# Patient Record
Sex: Female | Born: 1970 | Race: White | Hispanic: No | Marital: Single | Smoking: Current every day smoker
Health system: Southern US, Community
[De-identification: ages and names within clinical notes are randomized; demographics above are authoritative.]

## PROBLEM LIST (undated history)

## (undated) DIAGNOSIS — E785 Hyperlipidemia, unspecified: Secondary | ICD-10-CM

## (undated) DIAGNOSIS — Z8619 Personal history of other infectious and parasitic diseases: Secondary | ICD-10-CM

## (undated) DIAGNOSIS — F329 Major depressive disorder, single episode, unspecified: Secondary | ICD-10-CM

## (undated) DIAGNOSIS — F419 Anxiety disorder, unspecified: Secondary | ICD-10-CM

## (undated) DIAGNOSIS — E119 Type 2 diabetes mellitus without complications: Secondary | ICD-10-CM

## (undated) DIAGNOSIS — J449 Chronic obstructive pulmonary disease, unspecified: Secondary | ICD-10-CM

## (undated) DIAGNOSIS — R0602 Shortness of breath: Secondary | ICD-10-CM

## (undated) DIAGNOSIS — K760 Fatty (change of) liver, not elsewhere classified: Secondary | ICD-10-CM

## (undated) DIAGNOSIS — M8580 Other specified disorders of bone density and structure, unspecified site: Secondary | ICD-10-CM

## (undated) DIAGNOSIS — D649 Anemia, unspecified: Secondary | ICD-10-CM

## (undated) DIAGNOSIS — J45909 Unspecified asthma, uncomplicated: Secondary | ICD-10-CM

## (undated) DIAGNOSIS — F32A Depression, unspecified: Secondary | ICD-10-CM

## (undated) DIAGNOSIS — Z86718 Personal history of other venous thrombosis and embolism: Secondary | ICD-10-CM

## (undated) DIAGNOSIS — K219 Gastro-esophageal reflux disease without esophagitis: Secondary | ICD-10-CM

## (undated) HISTORY — DX: Fatty (change of) liver, not elsewhere classified: K76.0

## (undated) HISTORY — PX: OTHER SURGICAL HISTORY: SHX169

## (undated) HISTORY — DX: Personal history of other infectious and parasitic diseases: Z86.19

## (undated) HISTORY — PX: TONSILLECTOMY: SUR1361

## (undated) HISTORY — PX: CHOLECYSTECTOMY: SHX55

## (undated) HISTORY — DX: Other specified disorders of bone density and structure, unspecified site: M85.80

## (undated) HISTORY — PX: TUBAL LIGATION: SHX77

## (undated) HISTORY — DX: Gastro-esophageal reflux disease without esophagitis: K21.9

## (undated) HISTORY — DX: Personal history of other venous thrombosis and embolism: Z86.718

## (undated) HISTORY — DX: Hyperlipidemia, unspecified: E78.5

---

## 2000-11-18 ENCOUNTER — Other Ambulatory Visit: Admission: RE | Admit: 2000-11-18 | Discharge: 2000-11-18 | Payer: Self-pay | Admitting: Internal Medicine

## 2001-04-10 ENCOUNTER — Emergency Department (HOSPITAL_COMMUNITY): Admission: EM | Admit: 2001-04-10 | Discharge: 2001-04-10 | Payer: Self-pay | Admitting: Emergency Medicine

## 2001-04-12 ENCOUNTER — Encounter: Payer: Self-pay | Admitting: Emergency Medicine

## 2001-04-12 ENCOUNTER — Ambulatory Visit (HOSPITAL_COMMUNITY): Admission: RE | Admit: 2001-04-12 | Discharge: 2001-04-12 | Payer: Self-pay | Admitting: Emergency Medicine

## 2001-05-13 ENCOUNTER — Observation Stay (HOSPITAL_COMMUNITY): Admission: RE | Admit: 2001-05-13 | Discharge: 2001-05-14 | Payer: Self-pay | Admitting: General Surgery

## 2001-08-27 ENCOUNTER — Encounter: Payer: Self-pay | Admitting: *Deleted

## 2001-08-27 ENCOUNTER — Emergency Department (HOSPITAL_COMMUNITY): Admission: EM | Admit: 2001-08-27 | Discharge: 2001-08-27 | Payer: Self-pay | Admitting: *Deleted

## 2001-09-02 ENCOUNTER — Encounter: Payer: Self-pay | Admitting: Orthopaedic Surgery

## 2001-09-02 ENCOUNTER — Ambulatory Visit (HOSPITAL_COMMUNITY): Admission: RE | Admit: 2001-09-02 | Discharge: 2001-09-02 | Payer: Self-pay | Admitting: Orthopaedic Surgery

## 2001-11-11 ENCOUNTER — Encounter: Payer: Self-pay | Admitting: *Deleted

## 2001-11-11 ENCOUNTER — Ambulatory Visit (HOSPITAL_COMMUNITY): Admission: RE | Admit: 2001-11-11 | Discharge: 2001-11-11 | Payer: Self-pay | Admitting: *Deleted

## 2002-03-31 ENCOUNTER — Encounter: Payer: Self-pay | Admitting: *Deleted

## 2002-03-31 ENCOUNTER — Emergency Department (HOSPITAL_COMMUNITY): Admission: EM | Admit: 2002-03-31 | Discharge: 2002-03-31 | Payer: Self-pay | Admitting: *Deleted

## 2003-01-21 ENCOUNTER — Emergency Department (HOSPITAL_COMMUNITY): Admission: EM | Admit: 2003-01-21 | Discharge: 2003-01-21 | Payer: Self-pay | Admitting: Emergency Medicine

## 2003-03-15 ENCOUNTER — Other Ambulatory Visit: Admission: RE | Admit: 2003-03-15 | Discharge: 2003-03-15 | Payer: Self-pay | Admitting: Obstetrics and Gynecology

## 2003-04-16 ENCOUNTER — Emergency Department (HOSPITAL_COMMUNITY): Admission: EM | Admit: 2003-04-16 | Discharge: 2003-04-16 | Payer: Self-pay | Admitting: Emergency Medicine

## 2003-10-29 ENCOUNTER — Inpatient Hospital Stay (HOSPITAL_COMMUNITY): Admission: RE | Admit: 2003-10-29 | Discharge: 2003-10-31 | Payer: Self-pay | Admitting: Obstetrics and Gynecology

## 2004-11-18 ENCOUNTER — Ambulatory Visit: Payer: Self-pay | Admitting: Internal Medicine

## 2004-12-09 ENCOUNTER — Ambulatory Visit (HOSPITAL_COMMUNITY): Admission: RE | Admit: 2004-12-09 | Discharge: 2004-12-09 | Payer: Self-pay | Admitting: Internal Medicine

## 2004-12-09 ENCOUNTER — Ambulatory Visit: Payer: Self-pay | Admitting: Internal Medicine

## 2005-02-05 ENCOUNTER — Ambulatory Visit: Payer: Self-pay | Admitting: Internal Medicine

## 2005-06-04 ENCOUNTER — Ambulatory Visit (HOSPITAL_COMMUNITY): Admission: RE | Admit: 2005-06-04 | Discharge: 2005-06-04 | Payer: Self-pay | Admitting: Family Medicine

## 2005-11-02 ENCOUNTER — Ambulatory Visit (HOSPITAL_COMMUNITY): Admission: RE | Admit: 2005-11-02 | Discharge: 2005-11-02 | Payer: Self-pay | Admitting: Obstetrics and Gynecology

## 2005-12-14 ENCOUNTER — Observation Stay (HOSPITAL_COMMUNITY): Admission: AD | Admit: 2005-12-14 | Discharge: 2005-12-15 | Payer: Self-pay | Admitting: Obstetrics and Gynecology

## 2006-01-22 ENCOUNTER — Ambulatory Visit (HOSPITAL_COMMUNITY): Admission: AD | Admit: 2006-01-22 | Discharge: 2006-01-22 | Payer: Self-pay | Admitting: Obstetrics and Gynecology

## 2006-01-26 ENCOUNTER — Ambulatory Visit (HOSPITAL_COMMUNITY): Admission: AD | Admit: 2006-01-26 | Discharge: 2006-01-26 | Payer: Self-pay | Admitting: Obstetrics and Gynecology

## 2006-01-29 ENCOUNTER — Ambulatory Visit (HOSPITAL_COMMUNITY): Admission: AD | Admit: 2006-01-29 | Discharge: 2006-01-29 | Payer: Self-pay | Admitting: Obstetrics and Gynecology

## 2006-02-01 ENCOUNTER — Inpatient Hospital Stay (HOSPITAL_COMMUNITY): Admission: RE | Admit: 2006-02-01 | Discharge: 2006-02-03 | Payer: Self-pay | Admitting: Obstetrics and Gynecology

## 2006-02-01 ENCOUNTER — Encounter (INDEPENDENT_AMBULATORY_CARE_PROVIDER_SITE_OTHER): Payer: Self-pay | Admitting: *Deleted

## 2006-07-27 ENCOUNTER — Ambulatory Visit (HOSPITAL_COMMUNITY): Admission: RE | Admit: 2006-07-27 | Discharge: 2006-07-27 | Payer: Self-pay | Admitting: Family Medicine

## 2007-01-12 ENCOUNTER — Ambulatory Visit: Payer: Self-pay | Admitting: Orthopedic Surgery

## 2007-05-03 ENCOUNTER — Ambulatory Visit: Payer: Self-pay | Admitting: Internal Medicine

## 2007-05-05 ENCOUNTER — Ambulatory Visit (HOSPITAL_COMMUNITY): Admission: RE | Admit: 2007-05-05 | Discharge: 2007-05-05 | Payer: Self-pay | Admitting: Internal Medicine

## 2007-09-11 ENCOUNTER — Emergency Department (HOSPITAL_COMMUNITY): Admission: EM | Admit: 2007-09-11 | Discharge: 2007-09-11 | Payer: Self-pay | Admitting: Emergency Medicine

## 2007-10-25 ENCOUNTER — Ambulatory Visit: Payer: Self-pay | Admitting: Internal Medicine

## 2007-10-26 ENCOUNTER — Ambulatory Visit (HOSPITAL_COMMUNITY): Admission: RE | Admit: 2007-10-26 | Discharge: 2007-10-26 | Payer: Self-pay | Admitting: Internal Medicine

## 2009-10-03 ENCOUNTER — Other Ambulatory Visit: Admission: RE | Admit: 2009-10-03 | Discharge: 2009-10-03 | Payer: Self-pay | Admitting: Obstetrics & Gynecology

## 2010-05-13 ENCOUNTER — Ambulatory Visit: Payer: Self-pay | Admitting: Cardiology

## 2010-06-24 ENCOUNTER — Encounter: Admission: RE | Admit: 2010-06-24 | Discharge: 2010-06-24 | Payer: Self-pay | Admitting: Gastroenterology

## 2010-07-01 ENCOUNTER — Encounter: Admission: RE | Admit: 2010-07-01 | Discharge: 2010-07-01 | Payer: Self-pay | Admitting: Gastroenterology

## 2010-08-30 ENCOUNTER — Encounter: Payer: Self-pay | Admitting: Gastroenterology

## 2010-12-23 NOTE — Assessment & Plan Note (Signed)
NAME:  [name] [name], [name] [name]                 CHART#:  [id number]   DATE:                                   DOB:  1971-07-10   REASON FOR CONSULTATION:  Elevated liver enzymes.   HISTORY OF PRESENT ILLNESS:  The patient is a very pleasant 40 year old  Caucasian female sent over at the courtesy of Dr. Earley Favor for  further evaluation of elevated liver enzymes.  According to the  accompanying data, the patient was noted to have an elevated alkaline  phosphatase, AST and ALT back on 04/22/2007.  Specifically, she had an  alkaline phosphatase of 121 which is actually down from 149 in October  of 2006.  AST and ALT were elevated at 176 and 60 respectively as  compared to 58 and 188 respectively back in the fall of 2006.   Serological workup followed including a negative HIV screen, hepatitis  B, surface antigen and hepatitis C antibody came back negative.  The  patient denies ever being told she had hepatitis.  She denies ever  having yellow jaundice.  She tells me she has donated blood at the [facility] as recently as 5 years ago and never received a rejection letter.   I do have an old set of labs from [facility] from April of  2006 which revealed an ALT of 15, AST of 14, total bilirubin 0.5,  alkaline phosphatase of 70.   There is no family history of liver disease.  There is no history of  herbal/vitamin/over-the-counter nutritional supplements.   She does have a history of tattoos.  She acquired one earlier this year  and another some 5 years ago.  No history of sexual promiscuity or  street drugs/parenteral drug use.  She has never received a blood  transfusion and she does not consume alcohol.   It is notable this lady has gained a good 34 pounds in the past 2 years  according to our records.   PAST MEDICAL HISTORY:  Longstanding gastroesophageal reflux disease,  symptoms controlled with omeprazole 20 mg orally daily.  Dr. Dionicia Abler saw  this nice lady back in 2006 when  she was evaluated for reflux and  atypical chest pains along with chronic constipation and intermittent  episodes of hematochezia.  She underwent EGD and colonoscopy on  12/09/2004 which revealed a small hiatal hernia, two small polyps in the  colon which were biopsied/removed.  She also had some internal  hemorrhoids thought to be the cause of bleeding.  The polyps were benign  histologically.  She has a history of anxiety, neurosis, depression.   PAST SURGICAL HISTORY:  Tubal ligation, endoscopic evaluation outlined  above, cholecystectomy, cholelithiasis years ago, tonsillectomy.   CURRENT MEDICATIONS:  1. Motrin p.r.n.  2. Midol p.r.n.  3. Tylenol Sinus p.r.n.  4. Omeprazole 20 mg daily.  5. Diazepam 10 mg b.i.d.  6. Paroxetine HCL 20 mg daily.   ALLERGIES:  No known drug allergies.   FAMILY HISTORY:  Father died with metastatic cancer to the lymph nodes,  liver, lung, primary unknown to me.  No history of chronic GI or liver  illness otherwise.   SOCIAL HISTORY:  Patient is single and has two twins that are 40 months  old.  She was working in a [employer]  but quit to take care of  her kids.  She smokes two packs of cigarettes per day.  No alcohol.  No  illicit drugs.   REVIEW OF SYSTEMS:  No chest pain.  No dyspnea on exertion.  She does  not have any early __________  nausea or vomiting, and no odynophagia  and dysphagia.  She does have vague upper abdominal pain from time to  time, and has alternating constipation and diarrhea over the years  chronically, more nonbloody diarrhea recently.   PHYSICAL EXAMINATION:  GENERAL:  Reveals a pleasant 40 year old lady  resting comfortably.  VITAL SIGNS:  Weight:  160, was 126 on 11/18/2004 by our records.  Temperature:  98.1.  Blood pressure:  100/80.  Pulse:  80.  Height:  5'1.  SKIN:  Warm and dry.  She has tattoos but no jaundice __________  chronic liver disease.  HEENT:  No scleral icterus.  Conjunctivae are pink.   Oral cavity, no  lesions.  CHEST:  Lungs are clear to auscultation.  CARDIAC:  Regular rate and rhythm without breast exam.  ABDOMEN:  Nondistended.  Positive bowel sounds, soft and nontender  without appreciable mass or hepatosplenomegaly.  EXTREMITIES:  No edema.   IMPRESSION:  The patient is a very pleasant 40 year old lady with a  notable elevation in her aminotransferase and alkaline phosphatase going  back now two years.  It is good to see she had a negative screen for  hepatitis B, C and HIV.  She has gained a significant amount of weight  over the past two years and I suspect we may well be dealing with  nonalcoholic fatty liver disease, specifically steatohepatitis.  She  does need further evaluation of other potential causes of mixed  hepatocellular and cholestatic enzyme picture.  We will need to screen  her for autoimmune hepatitis as well as primary biliary cirrhosis and  iron overload.  I doubt she has a retained common duct stone.   RECOMMENDATIONS:  We will obtain a right upper quadrant/hepatic  ultrasound, repeat hepatic profile, obtain an INR today.  We will go  ahead and get parent studies on this lady and obtain an AMA, ANA, and  antismooth muscle antibody today.   I spent some time talking to the patient about obesity and the  relationship between steatohepatitis.  I have encouraged weight loss and  strive to obtain a more ideal body weight with aerobic exercise at least  30 minutes three times a week.  I will make further recommendations in  the very near future once we have the above mentioned studies back for  review.   I would like to thank Dr. Earley Favor for allowing me to see this  nice lady today.       Jonathon Bellows, M.D.  Electronically Signed     RMR/MEDQ  D:  05/03/2007  T:  05/03/2007  Job:  [id number]   cc:   Jeoffrey Massed, MD

## 2010-12-23 NOTE — Assessment & Plan Note (Signed)
NAME:  [name] [name], [name] [name]                 CHART#:  [id number]   DATE:  10/25/2007                       DOB:  03-23-1971   PRIMARY CARE PHYSICIAN:  Vivia Ewing, M.D.   CHIEF COMPLAINT:  Chronic diarrhea.   HISTORY OF PRESENT ILLNESS:  [name] [name] is a 40 year old Caucasian female.  She has previously been evaluated by [place] for chronic constipation as well  as GERD and transaminitis back last fall. She was felt to have fatty  liver and therefore, she underwent AMA, ANA, and anti-SMA, which were  all negative. She did not return for followup until today. She was  referred because of a 1 month history of chronic diarrhea. She states  every time she eats, she feels sick. She complains of nausea. She has to  make herself eat. She has lost 22 pounds in the last 6 months. She is  having about 6 loose, watery bowel movements per day with some mucous.  Denies any blood. She notes the weight loss is entirely unintentional.  She denies any recent antibiotic use. Denies any medications. She  occasional takes some Motrin. She is on Omeprazole 20 mg daily. She was  having some breakthrough heartburn and indigestion. This was increased  to b.i.d. last week. She was given Lomotil. This did seem to help some.  She has tried Imodium over-the-counter, which helps a little too. She  denies any problems with anxiety or depression at this time. She is  under a significant amount of stress with a set of 23-year-old twins that  are autistic and a 77-year-old child at home as well. She does stay at  home with the children currently. She is having abdominal pain. It is  mostly from her mid to lower abdomen. It does radiate through to her low  back.   She has had an EGD and colonoscopy by Dr. Karilyn Cota on Dec 09, 2004, which  showed a small hiatal hernia, 2 polyps in the colon, which were biopsied  and benign and internal hemorrhoids, thought to be the cause of her  bleeding.   She had white blood cell count of 12.2, normal  hemoglobin and  hematocrit, and platelet count. She had an alkaline phosphatase of 142,  AST of 45, and otherwise, normal CMP. She had a normal TSH, normal sed  rate and amylase and lipase. A normal C-reactive protein and negative H-  Pylori.   CURRENT MEDICATIONS:  See the list from October 25, 2007.   ALLERGIES:  NO KNOWN DRUG ALLERGIES.   PHYSICAL EXAMINATION:  VITAL SIGNS:  Weight 138.5 pounds. Height 61  inches. Temperature 98.1, blood pressure 100/78, pulse 80.  GENERAL:  A well developed, well nourished, Caucasian female in no acute  distress.  HEENT:  Sclerae clear. Conjunctivae pink. Oropharynx pink and moist  without any lesions.  NECK:  Supple. She does have bilateral swelling to her neck, non-tender  without firm masses or discrete thyromegaly. May represent goiter.  CHEST/HEART:  Regular rate and rhythm. Normal S1 and S2. Without murmur,  rub, or gallop.  ABDOMEN:  Positive bowel sounds x4. No bruits auscultated. Soft,  nontender, and nondistended. Without palpable hepatosplenomegaly. No  rebound or guarding.  EXTREMITIES:  Without clubbing or edema bilaterally.   ASSESSMENT:  [name] [name] is a 40 year old female with a significant weight  loss  of 22 pounds in the last 6 months, unintentionally. She has also  had chronic diarrhea for the last month. She has chronic  gastroesophageal reflux disease and has had to increase her PPI to  b.i.d. She is having chronic diarrhea with upward of 6 loose stools per  day. She had normal TSH and there is no evidence of inflammation with  CRP, making inflammatory bowel disease less likely. It is possible that  she could have irritable bowel syndrome, however, significant weight  loss is a red flag. I would be concerned about ruling out lymphoma,  celiac disease, and less likely would be inflammatory bowel disease,  given a negative CRP. It is reassuring that she did have an essentially  normal colonoscopy back in 2006. Given her  leukocytosis, chronic  abdominal pain, chronic diarrhea, and weight loss, she is going to  undergo CT scan of the abdomen and pelvis for further evaluation.   PLAN:  1. Full set of stool studies to include ova and parasite, culture and      sensitivity, C-diff, and lactoferrin.  2. Levsin 0.125 mg a.c. and at bedtime, #60 with no refills.  3. CT of abdomen and pelvis with IV and oral contrast.       Lorenza Burton, N.P.  Electronically Signed     R. Roetta Sessions, M.D.  Electronically Signed    KJ/MEDQ  D:  10/25/2007  T:  10/25/2007  Job:  [id number]   cc:   Francoise Schaumann. Halm, DO, FAAP

## 2010-12-26 NOTE — H&P (Signed)
NAME:  [name], [name]                ACCOUNT NO.:  [id number]   MEDICAL RECORD NO.:  [id number]          PATIENT TYPE:  OIB   LOCATION:  LDR5                          FACILITY:  [facility]   PHYSICIAN:  Tilda Burrow, M.D. DATE OF BIRTH:  [birth year]   DATE OF ADMISSION:  12/14/2005  DATE OF DISCHARGE:  LH                                HISTORY & PHYSICAL   REASON FOR ADMISSION:  Pregnancy at 28 weeks and 6 days with twin gestation  with diastolic blood pressure of 100.   MEDICAL HISTORY:  Positive for reflux and anxiety.   SURGICAL HISTORY:  Positive for cesarean section x2 and gallbladder.   ALLERGY:  She is allergic to SULFA.   MEDICATIONS:  She is on Lexapro.   FAMILY HISTORY:  Benign.   PRENATAL COURSE:  Has been complicated by twin gestation, female sex.  Ultrasound today shows normal growth of both fetuses.  Blood type is A  positive.  UDS is negative.  Rubella immune.  Hepatitis B surface antigen is  negative.  HIV is nonreactive.  HSV 2 is positive.  Serology is nonreactive.  Pap is normal.  GC and Chlamydia are negative.  The 28-week hemoglobin 10.3,  28-week hematocrit 32.3.  The 1-hour glucose 152, 3 hours as follows:  71,  166, 150, and 113.   PHYSICAL EXAMINATION:  Today, ultrasound reveals 2 stable intrauterine  fetuses both female.  Normal growth.  VITAL SIGNS:  Blood pressure diastolic is 100.  She has a trace of protein  and DTRs are 3+ to 4+ with no clonus.  HEART:  Regular rate and rhythm.  LUNGS:  Clear to auscultation bilaterally.  ABDOMEN:  Gravid, soft, nontender.   PLAN:  Admit, do a preeclampsia workup, and monitor.      Zerita Boers, Lanier Clam      Tilda Burrow, M.D.  Electronically Signed    DL/MEDQ  D:  16/05/9603  T:  12/14/2005  Job:  [id number]   cc:   Tilda Burrow, M.D.  Fax: [phone]

## 2010-12-26 NOTE — Op Note (Signed)
NAME:  [name] [name], [name] [name]                ACCOUNT NO.:  [id number]   MEDICAL RECORD NO.:  [id number]          PATIENT TYPE:  AMB   LOCATION:  DAY                           FACILITY:  [facility]   PHYSICIAN:  Lionel December, M.D.    DATE OF BIRTH:  08/25/1970   DATE OF PROCEDURE:  12/09/2004  DATE OF DISCHARGE:                                 OPERATIVE REPORT   PROCEDURE:  Esophagogastroduodenoscopy followed by colonoscopy.   ENDOSCOPIST:  Lionel December, M.D.   INDICATIONS FOR PROCEDURE:  [name] [name] is a 40 year old Caucasian female who  has a four-year history of gastroesophageal reflux disease.  Her symptoms  have been poorly controlled with three PPI's.  She also complains of  constipation and has had intermittent hematochezia.  She was recently seen  in the office and noted to have heme-positive stools.  She underwent a  diagnostic evaluation.   INFORMED CONSENT:  The procedure is reviewed with the patient and an  informed consent was obtained.   PREOPERATIVE MEDICATIONS:  Cetacaine spray for pharyngeal topical  anesthesia, Demerol 30 mg IV, Versed 10 mg IV in divided dose.   FINDINGS/DESCRIPTION OF PROCEDURE:  The procedure was performed in the  endoscopy suite.  The patient's vital signs and O2 saturation were monitored  during the procedure and remained stable.   ESOPHAGOGASTRODUODENOSCOPY:  The patient was placed in the left lateral  supine position.  The Olympus videoscope was passed in the oropharynx  without difficulty in to the esophagus.  The patient was somewhat agitated,  but I was able to quickly complete the exam.   ESOPHAGUS:  The mucosa was normal.  The GE junction was unremarkable.  There  was a small sliding hiatal hernia which was apparent.  She heaved three  times.   STOMACH:  It has some bile in it, but there was no food debris.  The stomach  distended very well with insufflation.  The folds in the proximal stomach  were normal.  Examination of the mucosa, body,  antrum, pyloric channel,  angularis, fundus and cardia were normal.   DUODENUM:  The bulbar mucosa was normal.  The scope was passed to the second  part of the duodenum.  The mucosa and folds are normal.  The endoscope was  withdrawn, and the patient was prepared for procedure number two.   COLONOSCOPY:  A rectal examination was performed.  No abnormality was noted  on external or digital exam.  The Olympus videoscope was placed through the  rectum and advanced under vision into the sigmoid colon and beyond.  The  prep was satisfactory.  The scope was passed to the cecum which was  identified by the appendiceal orifice and the ileocecal valve.  Pictures  were taken for the record.  A short segment of TI was also examined and was  normal.  As the scope was withdrawn.  The colonic mucosa was carefully  examined.  There was a single polyp at the transverse colon with erosion on  it.  This was cold biopsied.  The mucosa, the rest of the colon and the  rectum were normal.  The scope was retroflexed and examined in retroflexion,  and she had hemorrhoids above the dentate line.  The endoscope was  withdrawn.   The patient tolerated the procedures well.   FINAL DIAGNOSES:  1.  Small sliding hiatal hernia, otherwise normal      esophagogastroduodenoscopy.  2.  Small polyp located and biopsied from the transverse colon.  3.  Small internal hemorrhoids, felt to be the source of her hematochezia in      the setting of constipation.   RECOMMENDATIONS:  1.  Antireflux measures reinforced.  She needs to quit cigarette smoking.  2.  _____ 40 mg p.o. q.a.m.  She will pick up samples from the office.  3.  A high-fiber diet.  4.  Citrucel one tablespoonful daily at bedtime.  5.  Lactulose two tablespoonfuls daily at bedtime.  6.  She will keep a stool diary and return for OV in eight weeks from now.  7.  I will contact the patient with the biopsy results.  8.  We plan to see her back in eight  weeks.      NR/MEDQ  D:  12/09/2004  T:  12/09/2004  Job:  [id number]   cc:   Jeoffrey Massed, MD  [address]  [place]  [place] [address]  Fax: [phone]

## 2010-12-26 NOTE — Op Note (Signed)
NAME:  [name] [name], [name] [name]                ACCOUNT NO.:  [id number]   MEDICAL RECORD NO.:  [id number]          PATIENT TYPE:  INP   LOCATION:  A413                          FACILITY:  [facility]   PHYSICIAN:  Tilda Burrow, M.D. DATE OF BIRTH:  10/01/1970   DATE OF PROCEDURE:  DATE OF DISCHARGE:                                 OPERATIVE REPORT   PREOPERATIVE DIAGNOSES:  1.  Pregnancy at 37 weeks 5 days.  2.  Twin gestation.  3.  Desire for elective sterilization.  4.  Prior cesarean section after trial of labor.   POSTOPERATIVE DIAGNOSES:  1.  Pregnancy at 37 weeks 5 days, delivered.  2.  Twin gestation.  3.  Desire for elective sterilization.  4.  Prior cesarean section after trial of labor.  5.  Footling breech presentation x2.   PROCEDURES:  1.  Repeat low-transverse cervical cesarean section.  2.  Bilateral partial salpingectomy.  3.  Wide excision of cicatrix.   SURGEON:  Tilda Burrow, M.D.   ASSISTANTAmie Critchley, CST.   ANESTHESIA:  Spinal.   COMPLICATIONS:  None.   FINDINGS:  A healthy female infant x2.  Baby A was 5 pounds 14 ounces, female,  Apgars 9 and 9.  Baby B was 6 pounds 3.6 ounces, female, Apgars 9 and 9.  EBL  500 cc's.   DETAILS OF THE PROCEDURE:  The patient was taken to the operating room and  prepped and draped after spinal anesthesia with a Foley catheter in place.  Prior Pfannenstiel incision was performed, excising approximately 2 inches,  5-6 cm of skin and underlying fatty tissue, taking out the old scar.  We  opened the fascia transversely easily entered the abdominal cavity through a  midline incision. There was extensive thin, filmy omental adhesions to the  anterior abdominal wall and bladder flap area which could be resected and  quickly.  A bladder flap was developed on the lower uterine segment.  A  transverse uterine incision was performed and extended laterally using index  finger traction and the feet of baby A was grasped and complete  Breech  extraction performed using fundal pressure as the main propulsive force.  The arms were swept in front of the body and the head delivered in a  controlled fashion.  Bulb suction was performed promptly and the cord  clamped and the infant passed to Dr. Milinda Cave in good condition.  Apgars of 9  and 9 were assigned.   Meanwhile, the feet of baby B were identified through the membranes.  The  membranes were ruptured, the feet grasped, and again a complete Breech  extraction was performed using fundal pressure as the primary propulsive  force.  Bulb suction was performed as soon as the mouth was available and  the baby was delivered easily in a controlled fashion throughout.  The cord  was clamped.  The infant was passed to Dr. Milford Cage.  Apgars of 9 and 9 were  assigned subsequently.   Cord blood samples were obtained from babies A and B and then the placenta  delivered by Crede's  massage with placenta sent for pathology.  Each cord  had been a three-vessel cord.  Special note:  Baby B had had a nuchal cord  x2.   Amniotic fluid was clear and without malodor in both bags-of-water.   The uterus was closed with a single layer of running locking 0 chromic with  good tissue approximation. The bladder flap was reapproximated using 2-0  chromic.   TUBAL LIGATION:  The tubal ligation was performed by grasping each tube in  its mid-portion, doubly ligating around the incarcerated knuckle of tube and  excising the incarcerated specimen.  This was performed bilaterally.  No  cautery was used.   The peritoneum was closed with 2-0 chromic.   Wide excision of cicatrix was then completed by identifying that there was a  significant amount of laxity in the fascial layer.  Approximately 1-inch of  fascia was removed from the midline with tapering to each side, resulting in  less redundancy of the anterior abdominal wall tissues.  This was pulled  together with continuous running 0 Vicryl.  The  external and internal  oblique's were sewn separately on each side for a distance of approximately  5 cm.   The fibrotic portion of the scar over the mons pubis was mobilized to allow  tissue edge approximation and then interrupted 2-0 plain used to pull the  tissue edges together.  The patient then had a flat Jackson-Pratt drain  placed beneath the fatty tissues just above the fascia and then staple  closure of the skin completed the procedure with satisfactory cosmetic  results.  There was some revision necessary of the left corner of the  incision, but the ultimate result was cosmetically acceptable.  The patient  tolerated the procedure well, went to recovery room in good condition.  Sponge and needle counts were correct.      Tilda Burrow, M.D.  Electronically Signed     JVF/MEDQ  D:  02/01/2006  T:  02/01/2006  Job:  [id number]   cc:   [facility]

## 2010-12-26 NOTE — Op Note (Signed)
NAME:  [name] [name], [name] [name]                          ACCOUNT NO.:  [id number]   MEDICAL RECORD NO.:  [id number]                   PATIENT TYPE:  INP   LOCATION:  A402                                 FACILITY:  [facility]   PHYSICIAN:  Tilda Burrow, M.D.              DATE OF BIRTH:  04-03-1971   DATE OF PROCEDURE:  DATE OF DISCHARGE:                                 OPERATIVE REPORT   PREOPERATIVE DIAGNOSIS:  Pregnancy at 39 weeks with prior cesarean section,  not for trial of labor.   POSTOPERATIVE DIAGNOSIS:  Pregnancy at 39 weeks with prior cesarean section,  not for trial of labor.   PROCEDURE:  1. Repeat low transverse cesarean section.  2. Release of abdominal adhesions to uterus.   SURGEON:  Tilda Burrow, M.D.   ANESTHESIA:  Spinal.   COMPLICATIONS:  None.   FINDINGS:  Healthy female infant with Apgars of 9 and 9, weight __________.  Thick fibrotic adhesions from uterine fundus to anterior abdominal wall,  transected.   ESTIMATED BLOOD LOSS:  600 cc.   SPECIMENS:  None.   DESCRIPTION OF PROCEDURE:  The patient was taken to the operating room and  spinal anesthesia introduced.  The abdomen was prepped and draped, with  Foley catheter in place.   The old cicatrix was excised, removing a 2-cm wide ellipse of skin, which  allowed for improved appearance of the incision subsequently.  The fascia  was opened in the standard method of Pfannenstiel and the bladder flap  developed on the lower uterine segment.  The transverse uterine incision was  made without difficulty, extended laterally using index finger traction,  amniotic membranes ruptured revealing clear fluid without malodor, and the  fetal vertex guided through the incision using vacuum extractor to guide the  vertex while fundal pressure was used as the primary propulsive force.  Delivery was quite easily accomplished.  The infant was then bulb suctioned,  transferred to the care of Dr. Francoise Schaumann. Halm whose notes are  dictated  elsewhere.   Cord blood samples were obtained.  The placenta delivered intact, Schultze  presentation.  The lower uterine segment, which was quite thick, was closed  with a single layer of running locking closure with 0 chromic, with good  hemostasis.  A 2-0 chromic interrupted suture reapproximated the bladder  flap.   The uterine fundus was somewhat immobile, and palpation inside the abdomen  revealed that the uterine fundus was attached by some elongate adhesions to  the myometrium, with dense adhesions to the anterior abdominal wall at the  level of the umbilicus.  These were clamped, cut, and transected and ligated  with 2-0 chromic.  The anterior peritoneum was then closed with continuous  running 2-0 chromic after uterine and abdominal irrigation, and then the  patient had 0 Vicryl closure of the fascia, 2-0 plain interrupted  reapproximation of the subcutaneous fatty tissues, and staple  closure of the  skin.   The patient tolerated the procedure well and went to the recovery room in  good condition.      ___________________________________________                                            Tilda Burrow, M.D.   JVF/MEDQ  D:  10/29/2003  T:  10/29/2003  Job:  [id number]   cc:   Francoise Schaumann. Halm, D.O.  [address], [address]  [place]  [place] [address]  Fax: [phone]

## 2010-12-26 NOTE — H&P (Signed)
NAME:  [name] [name], [name] [name]                          ACCOUNT NO.:  [id number]   MEDICAL RECORD NO.:  [id number]                   PATIENT TYPE:  AMB   LOCATION:  DAY                                  FACILITY:  [facility]   PHYSICIAN:  Tilda Burrow, M.D.              DATE OF BIRTH:  28-Apr-1971   DATE OF ADMISSION:  10/29/2003  DATE OF DISCHARGE:                                HISTORY & PHYSICAL   ADMITTING DIAGNOSES:  1. Pregnancy, 39 weeks' gestation, prior cesarean section, not for trial of     labor, scheduled for repeat cesarean section.  2. 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   HISTORY OF PRESENT ILLNESS:  This 40 year old female, gravida 2, para 1,  Ab0, LMP January 29, 2003, placing menstrual [facility] November 06, 2003 with ultrasound  [facility] matching almost exactly.  She is admitted at 39 weeks by con census  criteria for cesarean section.  She was seen in our office with appropriate  weight gain and fundal height through the pregnancy.  Most recently, her 45-  week gestation GC culture returned up positive; she was treated with  azithromycin, with proof of cure on October 24, 2003 returned positive as  well; she was therefore given Vantin 400 mg orally on October 26, 2003.  We  will retest if necessary.  The patient reports there was a single contact  episode that may have been the source of infection.  That person is being  contacted.   PAST MEDICAL HISTORY:  Past medical history benign.   SURGICAL HISTORY:  1. Tonsillectomy.  2. Laparoscopic cholecystectomy, October 2003.  3. Cesarean section in 1994.  4. Cholecystectomy in 2003.   ALLERGIES:  Allergies to SULFA DRUGS causing a rash.   SOCIAL HISTORY:  Single.  Lives alone with son, estranged from biologic  father.  This is his first child; he is uninvolved and not likely to change.   PRENATAL COURSE:  Prenatal course is notable for Down'[name] syndrome risk of 1  in 48 with a  referral to [facility]; the patient declined  amniocentesis.  She accepted a targeted ultrasound which was within normal  limits, which only halves the risk of Down'[name] syndrome, which remained a 1 in  120 based on ultrasound.   PRENATAL LABORATORY DATA:  Blood type A-positive; urine drug screen  negative; rubella immunity present; hemoglobin 15, hematocrit 43, dropping  to 9.9 and 29.8 at 28 weeks with iron recommended; glucose tolerance test  121 mg%.   PHYSICAL EXAM:  VITAL SIGNS:  Height 5 feet 1 inch.  Weight 175.  Blood  pressure 118/72.  GENERAL:  General exam shows a healthy-appearing somber Caucasian female,  alert and oriented x3.  HEENT:  Pupils equal, round and reactive.  NECK:  Neck with large tattoo of longstanding duration, left side of neck.  CARDIOVASCULAR:  Exam unremarkable.  ABDOMEN:  Fundal height 39 cm, estimated fetal weight 7-1/2 pounds.  PELVIC:  Cervix long and thick, closed at last exam.   PLAN:  Repeat cesarean section on October 29, 2003.     ___________________________________________                                         Tilda Burrow, M.D.   JVF/MEDQ  D:  10/26/2003  T:  10/26/2003  Job:  [id number]   cc:   Francoise Schaumann. Halm, D.O.  [address]  [place]  [place] [address]  Fax: [phone]

## 2010-12-26 NOTE — Consult Note (Signed)
NAME:  [name] [name], [name] [name]                ACCOUNT NO.:  [id number]   MEDICAL RECORD NO.:  [id number]          PATIENT TYPE:  AMB   LOCATION:                                 FACILITY:   PHYSICIAN:  Lionel December, M.D.    DATE OF BIRTH:  [birth year]   DATE OF CONSULTATION:  DATE OF DISCHARGE:                                   CONSULTATION   REQUESTING PHYSICIAN:  Dr. Milinda Cave.   REASON FOR CONSULTATION:  Refractory GERD, chronic constipation.   HISTORY OF PRESENT ILLNESS:  [name] [name] is a 40 year old Caucasian female who  reports a history of chronic GERD.  She has been on Protonix about 3-4 years  ago with minimal relief and more recently has been tried on Prevacid for 2  months with no relief.  She notes most of her symptoms are described as  attacks where she has severe substernal atypical chest pain which radiates  through to her back.  She also complains of indigestion and postprandial  nausea.  She also describes abdominal bloating and belching.  Most of her  chest pain symptoms are relieved with belching.  She denies any  regurgitation, dysphagia or odynophagia.  She denies any vomiting.  She  denies anorexia.  She denies early satiety.  She does complain of chronic  constipation and has bowel movements anywhere from every 1-4 days unless she  takes her Citrucel.  Generally with Citrucel she can have a bowel movement  about once a day.  She has noted on at least four occasions a small amount  of bright red rectal bleeding in the toilet and on toilet paper as well.  She denies any mucus in her stools or melena.   PAST MEDICAL HISTORY:  Depression and GERD.  C-section times two,  cholecystectomy in 2002 secondary to cholelithiasis by Dr. Katrinka Blazing,  tonsillectomy.   CURRENT MEDICATIONS:  1.  Citrucel daily.  2.  Lexapro 10 mg daily.  3.  Motrin p.r.n.   ALLERGIES:  No known drug allergies.   FAMILY HISTORY:  No known family history of colorectal carcinoma, liver or  chronic GI  problems other than her mother, age 58, is healthy and has had a  partial thyroidectomy and the father at age 74 is relatively healthy.  She  has one brother with hypertension.   SOCIAL HISTORY:  [name] [name] currently lives with her parents.  She has a 57-  and 37 year old son.  She is employed part time in the [employer].  She has  an at least 20-pack-year history of tobacco use and currently consumes about  two packs a day.  She denies any alcohol or drug use.   REVIEW OF SYSTEMS:  CONSTITUTIONAL:  Weight is stable.  Denies any fever or  chills.  See HPI.  CARDIOVASCULAR:  Denies any palpitations, diaphoresis.  PULMONARY:  Denies any shortness of breath, dyspnea, cough or hemoptysis.  GI:  See HPI.  GYN:  LMP started today.  She has normal 28-day cycles.  She  does have some heavy menses with some abdominal cramping.   PHYSICAL EXAMINATION:  VITAL SIGNS:  Weight is 126 pounds, height 61 inches,  temperature 97.9.  Blood pressure 106/78, pulse 76.  GENERAL:  [name] [name] is a 40 year old Caucasian female, who is alert,  oriented, pleasant and cooperative, in no acute distress.  HEENT:  Tonsils clear.  The oropharynx is pink and moist without lesions.  NECK:  Supple.  She does appear to have small goiter.  The left appears  greater than right.  She does have a tattoo to the left cervical neck.  CHEST:  Heart is regular rate and rhythm with normal S1, S2.  LUNGS:  Clear to auscultation bilaterally.  ABDOMEN:  Flat with right upper quadrant scars consistent with the previous  cholecystectomy.  Positive bowel sounds times four.  No bruits auscultated.  Soft, nontender, nondistended without palpable mass or hepatosplenomegaly.  No rebound tenderness or guarding.  RECTAL:  She does have a significant amount of dried blood to the perineum  from menses.  The perineum was cleansed with a damp tissue.  A rectal exam  was performed.  There were no external lesions visualized.  Good sphincter  tone.   No internal masses were palpated.  A small amount of clear secretions  were obtained from the vault which were Hemoccult-positive.  EXTREMITIES:  Two plus pedal pulses bilaterally.  No edema.  SKIN:  Pink, warm and dry without any rash or jaundice.   IMPRESSION:  [name] [name] is a 40 year old Caucasian female with a 4-year  history of chronic gastroesophageal reflux disease who presents with attacks  of atypical chest pain, usually substernally, which radiates through to her  back.  This coincides with postprandial nausea and abdominal bloating as  well as some indigestion.  It appears most of her symptoms could be related  to refractory GERD.  She has not responded well to two trials of PPI.  Therefore further evaluation is warranted.   She also has a history of chronic constipation and at least four episodes of  small-volume, intermittent hematochezia, found to be hemoccult positive on  exam today.  Therefore further evaluation is warranted.  More than likely  her symptoms are related to chronic constipation and straining with benign  anorectal bleeding.  However, given her exam, colorectal carcinoma should be  ruled out.   Incidentally she was found to have possibly a small goiter on exam, and we  will check a TSH.   RECOMMENDATIONS:  1.  We will requested recent labs from Dr. Samul Dada office.  2.  We will check a TSH.  3.  We will schedule a colonoscopy and EGD with Dr. Karilyn Cota in the near      future.  I have discussed with her the procedure including risks and      benefits, which include but are not limited to bleeding, infection,      perforation, drug reaction.  Consent was obtained.  4.  Further recommendations pending procedure.      KC/MEDQ  D:  11/18/2004  T:  11/18/2004  Job:  [id number]

## 2010-12-26 NOTE — Discharge Summary (Signed)
NAME:  [name] [name], [name] [name]              ACCOUNT NO.:  [id number]   MEDICAL RECORD NO.:  [id number]          PATIENT TYPE:  INP   LOCATION:  A413                          FACILITY:  [facility]   PHYSICIAN:  Tilda Burrow, M.D. DATE OF BIRTH:  07/07/1971   DATE OF ADMISSION:  02/01/2006  DATE OF DISCHARGE:  06/27/2007LH                                 DISCHARGE SUMMARY   ADMITTING DIAGNOSES:  Pregnancy; 37-1/2 week twin gestation with symmetric  growth, elective permanent sterilization, prior cesarean section after trial  of labor.   DISCHARGE DIAGNOSES:  Pregnancy, 37-1/2 week twin gestation with symmetric  growth, elective permanent sterilization, prior cesarean section after trial  of labor, double footling breech infant x2.   PROCEDURE:  Repeat low transverse cervical cesarean section, bilateral  partial salpingectomy, wide excision of cicatrix.   FOLLOWUP:  February 08, 2006 for staple and drain removal.   DISCHARGE MEDICATIONS:  1.  Darvocet-N 100 20 tablets 1-2 q.4 h. p.r.n. pain, refill x1.  2.  Prenatal vitamins daily.   HOSPITAL SUMMARY:  This 40 year old female gravida 3, para 2, twin gestation  followed through the pregnancy with symmetric growth submitted at 37 weeks,  5 days for repeat cesarean section and tubal ligation. MSTs have been  reactive, and she has begun to show some contractions. Fundal height is 47  cm. Estimated fetal weight 5-1/2 pounds each.   HOSPITAL COURSE:  The patient was admitted. Underwent repeat C-section,  tubal ligation with wide excision of cicatrix, delivering a 5 pound 14 ounce  female infant, baby A, and 6 pound 2 ounce female infant, baby B, both with  Apgars 9 and 9.   Postoperative course was excellent with patient showing excellent care of  the babies. Postoperative hemoglobin was 11, hematocrit 32.3 compared to  11.8 and 34 preop. Maternal blood type A positive. She showed excellent  recovery and was discharged on postop day #2.      Tilda Burrow, M.D.  Electronically Signed     JVF/MEDQ  D:  02/03/2006  T:  02/03/2006  Job:  [id number]

## 2010-12-26 NOTE — Discharge Summary (Signed)
NAME:  [name] [name], [name] [name]                          ACCOUNT NO.:  [id number]   MEDICAL RECORD NO.:  [id number]                   PATIENT TYPE:  INP   LOCATION:  A402                                 FACILITY:  [facility]   PHYSICIAN:  Tilda Burrow, M.D.              DATE OF BIRTH:  [birth year]   DATE OF ADMISSION:  10/29/2003  DATE OF DISCHARGE:  10/31/2003                                 DISCHARGE SUMMARY   ADMITTING DIAGNOSIS:  1. Pregnancy [redacted] weeks gestation.  2. Prior cesarean section, not for trial of labor.  3. Scheduled for repeat cesarean section.   DISCHARGE DIAGNOSIS:  Pregnancy [redacted] weeks gestation, delivered.   PROCEDURE:  Repeat low transverse cervical cesarean section.   DISCHARGE MEDICATIONS:  Tylox 1 q.4h. p.r.n. pain, dispense #15.   HOSPITAL SUMMARY:  This 40 year old female, gravida 2, para 1, AB 0 at [redacted]  weeks gestation was admitted for repeat cesarean section.  Prenatal course  is documented in the admitting history and accompanying prenatal record; and  was notable for gonorrhea culture positive at 36 weeks; treated with  azithromycin, proof of cure returning October 24, 2003 as being positive as  well, therefore, given Vantin 400 mg orally on October 26, 2003.   MEDICAL HISTORY:  Benign.   SURGICAL HISTORY:  Positive for tonsillectomy, laparoscopic cholecystectomy  in October 2003, C-section 1994.   ALLERGIES:  SULFA drugs which cause a rash.   PHYSICAL EXAMINATION:  VITAL SIGNS:  Height 5 feet 1 inch, weight 175.  Blood pressure 118/72.  HEENT:  Within normal limits  NECK:  A large tattoo on the left side of the neck.  ABDOMEN:  Shows a 39 cm fundal height.  Estimated fetal weight 7-1/2 pounds.  Cervix long, closed, high.  GENITOURINARY:  Nonpurulent discharge at the last proof of cure check.   HOSPITAL COURSE:  The patient underwent repeat low transverse cervical  cesarean section by Dr. Jannifer Franklin on October 29, 2003 with a 600 cc blood  loss delivering a  health female infant.  Postpartum course was stable.  Hemoglobin 10.5, hematocrit 30.6, white count 23,900.   The patient received antibiotic prophylaxis at the time of surgery,  consisting of uterine irrigation.  She remained afebrile.  Her preoperative  white count had been 17,800 so the increase was not impressive.  Preoperative hemoglobin had also been 11.2, hematocrit 32.8.  Maternal blood  type was A positive.   Postpartum course was uneventful with the patient stable for discharge on  postoperative day 2; tolerating a regular diet with routine postpartum  instructions including discharge instructions for bottle feeding, baby care,  prevention of SIDS, normal infant diet 0-12 months, breast care for  nonbreast-feeding woman and post C-section instruction sheet.  Follow up in  my office in 1 week staple remove then 4 weeks postpartum check.     ___________________________________________  Tilda Burrow, M.D.   JVF/MEDQ  D:  11/06/2003  T:  11/06/2003  Job:  [id number]

## 2010-12-26 NOTE — H&P (Signed)
NAME:  [name] [name], [name] [name]                ACCOUNT NO.:  [id number]   MEDICAL RECORD NO.:  [id number]          PATIENT TYPE:  OIB   LOCATION:  A415                          FACILITY:  [facility]   PHYSICIAN:  Tilda Burrow, M.D. DATE OF BIRTH:  10-20-1970   DATE OF ADMISSION:  01/26/2006  DATE OF DISCHARGE:  06/19/2007LH                                HISTORY & PHYSICAL   ADMISSION DIAGNOSIS:  Pregnancy at 37-1/[redacted] weeks gestation, twin gestation  with symmetric growth.  Desires for elective permanent sterilization.   HISTORY OF PRESENT ILLNESS:  This 40 year old female, gravida 3, para 2, two  prior living children with twin gestation, followed this pregnancy through  [facility] with symmetric growth of the infants with both infants  weighing over five pounds when measured by ultrasound, January 21, 2006.  She  has had twice weekly nonstress test that are normal.  She is scheduled for  repeat cesarean section and tubal ligation on February 01, 2006 at 37 weeks 4  days.  She has been noted to have some mild contractions and her NST  recently but cervix remains closed, long and high at last exam.   PAST MEDICAL HISTORY:  1.  Reflux esophagitis.  2.  Anxiety.   PAST SURGICAL HISTORY:  1.  Cesarean section x2.  2.  Cholecystectomy.   ALLERGIES:  SULFA.   MEDICATIONS:  Lexapro, Prevacid.   PRENATAL LABS:  Blood type A positive.  Urine drug screen negative.  Rubella  immuned at the present.  Hemoglobin 13, hematocrit 41.  Hepatitis, HIV, RPR,  GC and Chlamydia are all negative.  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  Prenatal course has  been notable for an episode of left calf pain in March which was negative on  Doppler flow studies.  She has been on maternity leave since November 24, 2005  with return to work planned for April 16, 2006.  She plans to bottle  feeds.  Plans to tubal ligation for sterilization.  Postpartum and baby care  will by Dr. Ara Kussmaul.   PHYSICAL EXAMINATION:  VITAL SIGNS:   Height 5 foot 1, weight 198, blood  pressure 128/84.  GENERAL:  Healthy, alert, active, oriented.  No shortness of breath.  ABDOMEN:  Fundal height 46 to 47 cm.  Estimated fetal weight 5-1/2 pounds  each.  PELVIC:  Cervix closed high.  Baby A is vertex.   PLAN:  Repeat cesarean section, tubal ligation February 01, 2006.      Tilda Burrow, M.D.  Electronically Signed     JVF/MEDQ  D:  01/26/2006  T:  01/26/2006  Job:  [id number]   cc:   [facility]   Francoise Schaumann. Milford Cage DO, FAAP  Fax: [phone]

## 2012-02-17 ENCOUNTER — Encounter (INDEPENDENT_AMBULATORY_CARE_PROVIDER_SITE_OTHER): Payer: Self-pay | Admitting: Internal Medicine

## 2012-02-17 DIAGNOSIS — D649 Anemia, unspecified: Secondary | ICD-10-CM

## 2012-02-17 DIAGNOSIS — N920 Excessive and frequent menstruation with regular cycle: Secondary | ICD-10-CM

## 2012-02-17 DIAGNOSIS — D72829 Elevated white blood cell count, unspecified: Secondary | ICD-10-CM

## 2012-02-17 DIAGNOSIS — J449 Chronic obstructive pulmonary disease, unspecified: Secondary | ICD-10-CM

## 2012-03-23 ENCOUNTER — Encounter (INDEPENDENT_AMBULATORY_CARE_PROVIDER_SITE_OTHER): Payer: Medicaid Other | Admitting: Internal Medicine

## 2012-03-23 DIAGNOSIS — D72829 Elevated white blood cell count, unspecified: Secondary | ICD-10-CM

## 2012-03-23 DIAGNOSIS — D509 Iron deficiency anemia, unspecified: Secondary | ICD-10-CM

## 2012-04-06 ENCOUNTER — Encounter (HOSPITAL_COMMUNITY)
Admission: RE | Disposition: A | Discharge status: Home / Self Care | Payer: Self-pay | Source: Ambulatory Visit | Attending: Gastroenterology

## 2012-04-06 ENCOUNTER — Ambulatory Visit (HOSPITAL_COMMUNITY): Payer: Medicaid Other | Admitting: Anesthesiology

## 2012-04-06 ENCOUNTER — Encounter (HOSPITAL_COMMUNITY): Payer: Self-pay | Admitting: Anesthesiology

## 2012-04-06 ENCOUNTER — Encounter (HOSPITAL_COMMUNITY): Payer: Self-pay

## 2012-04-06 ENCOUNTER — Ambulatory Visit (HOSPITAL_COMMUNITY)
Admission: RE | Admit: 2012-04-06 | Discharge: 2012-04-06 | Disposition: A | Discharge status: Home / Self Care | Payer: Medicaid Other | Source: Ambulatory Visit | Attending: Gastroenterology | Admitting: Gastroenterology

## 2012-04-06 DIAGNOSIS — R1011 Right upper quadrant pain: Secondary | ICD-10-CM | POA: Insufficient documentation

## 2012-04-06 DIAGNOSIS — Z9089 Acquired absence of other organs: Secondary | ICD-10-CM | POA: Insufficient documentation

## 2012-04-06 DIAGNOSIS — R197 Diarrhea, unspecified: Secondary | ICD-10-CM | POA: Insufficient documentation

## 2012-04-06 HISTORY — DX: Chronic obstructive pulmonary disease, unspecified: J44.9

## 2012-04-06 HISTORY — PX: EUS: SHX5427

## 2012-04-06 HISTORY — DX: Anxiety disorder, unspecified: F41.9

## 2012-04-06 HISTORY — DX: Unspecified asthma, uncomplicated: J45.909

## 2012-04-06 HISTORY — DX: Depression, unspecified: F32.A

## 2012-04-06 HISTORY — DX: Major depressive disorder, single episode, unspecified: F32.9

## 2012-04-06 SURGERY — ESOPHAGOGASTRODUODENOSCOPY (EGD) WITH PROPOFOL
Anesthesia: Monitor Anesthesia Care

## 2012-04-06 MED ORDER — SODIUM CHLORIDE 0.9 % IV SOLN: INTRAVENOUS | Status: DC

## 2012-04-06 MED ORDER — BUTAMBEN-TETRACAINE-BENZOCAINE 2-2-14 % EX AERO
INHALATION_SPRAY | CUTANEOUS | Status: DC | PRN
  Administered 2012-04-06: 2 via TOPICAL

## 2012-04-06 MED ORDER — LACTATED RINGERS IV SOLN
INTRAVENOUS | Status: DC
  Administered 2012-04-06: 1000 via INTRAVENOUS

## 2012-04-06 MED ORDER — PROPOFOL INFUSION 10 MG/ML OPTIME
INTRAVENOUS | Status: DC | PRN
  Administered 2012-04-06: 180 ug/kg/min via INTRAVENOUS

## 2012-04-06 MED ORDER — FENTANYL CITRATE 0.05 MG/ML IJ SOLN: 25.0000 ug | INTRAMUSCULAR | Status: DC | PRN

## 2012-04-06 MED ORDER — MIDAZOLAM HCL 5 MG/5ML IJ SOLN
INTRAMUSCULAR | Status: DC | PRN
  Administered 2012-04-06: 2 mg via INTRAVENOUS

## 2012-04-06 MED ORDER — FENTANYL CITRATE 0.05 MG/ML IJ SOLN
INTRAMUSCULAR | Status: DC | PRN
  Administered 2012-04-06: 100 ug via INTRAVENOUS

## 2012-04-06 MED ORDER — LACTATED RINGERS IV SOLN
INTRAVENOUS | Status: DC | PRN
  Administered 2012-04-06: 09:00:00 via INTRAVENOUS

## 2012-04-06 SURGICAL SUPPLY — 15 items

## 2012-04-06 NOTE — Op Note (Signed)
[facility] [address] [place], [address]   ENDOSCOPIC ULTRASOUND PROCEDURE REPORT  PATIENT: [name] [name], [name] [name]  MR#: [id number] BIRTHDATE: August 19, 1970  GENDER: Female ENDOSCOPIST: Willis Modena, MD REFERRED BY:  Donzetta Sprung, MD PROCEDURE DATE:  04/06/2012 PROCEDURE:   EGD with biopsy; Endoscopic ultrasound ASA CLASS:      ASA II INDICATIONS:   Right upper quadrant abdominal pain, diarrhea MEDICATIONS: Cetacaine x 2; MAC anesthesia  DESCRIPTION OF PROCEDURE:   After the risks benefits and alternatives of the procedure were  explained, informed consent was obtained. The patient was then placed in the left, lateral, decubitus postion and IV sedation was administered. Throughout the procedure, the patients blood pressure, pulse and oxygen saturations were monitored continuously.  Under direct visualization, the     endoscope was introduced through the mouth and advanced to the second portion of the duodenum      .  Water was used as necessary to provide an acoustic interface.  Upon completion of the imaging, water was removed and the patient was sent to the recovery room in satisfactory condition.     FINDINGS:      Normal forward-viewing endoscopy; random duodenal biopsies were obtained with cold forceps.  Endoscopic ultrasound was then done with radial echoendoscope.  Liver diffusely hyperechoic consistent with steatosis.  Pancreas diffusely hyperechoic consistent with fatty pancreas.  No pancreatic mass and no chronic pancreatitis identified.  Extrahepatic biliary tree well-visualized; no bile duct wall thickening and no choledocholithiasis was seen.  Post-cholecystectomy.  Ampulla normal via EUS.  IMPRESSION:     As above.  No source of symptoms identified. Elevated liver tests likely from steatosis.  RECOMMENDATIONS:     1.  Watch for potential complications of procedure. 2.  Await biopsy results. 3.  Follow-up with me in office in 3-4 weeks; if  diarrhea persists, consider trial of cholestyramine.    _______________________________ Rosalie DoctorWillis Modena, MD 04/06/2012 10:43 AM   CC:

## 2012-04-06 NOTE — Anesthesia Preprocedure Evaluation (Addendum)
Anesthesia Evaluation  Patient identified by MRN, date of birth, ID band Patient awake    Reviewed: Allergy & Precautions, H&P , NPO status , Patient's Chart, lab work & pertinent test results  Airway Mallampati: II TM Distance: >3 FB Neck ROM: full    Dental  (+) Dental Advisory Given and Missing,    Pulmonary neg pulmonary ROS, asthma , COPD COPD inhaler, Current Smoker,  breath sounds clear to auscultation  Pulmonary exam normal       Cardiovascular Exercise Tolerance: Good negative cardio ROS  Rhythm:regular Rate:Normal     Neuro/Psych negative neurological ROS  negative psych ROS   GI/Hepatic negative GI ROS, Neg liver ROS, GERD-  Medicated and Controlled,  Endo/Other  negative endocrine ROS  Renal/GU negative Renal ROS  negative genitourinary   Musculoskeletal   Abdominal   Peds  Hematology negative hematology ROS (+)   Anesthesia Other Findings   Reproductive/Obstetrics negative OB ROS                          Anesthesia Physical Anesthesia Plan  ASA: II  Anesthesia Plan: MAC   Post-op Pain Management:    Induction:   Airway Management Planned:   Additional Equipment:   Intra-op Plan:   Post-operative Plan:   Informed Consent: I have reviewed the patients History and Physical, chart, labs and discussed the procedure including the risks, benefits and alternatives for the proposed anesthesia with the patient or authorized representative who has indicated his/her understanding and acceptance.   Dental Advisory Given  Plan Discussed with: CRNA and Surgeon  Anesthesia Plan Comments:        Anesthesia Quick Evaluation

## 2012-04-06 NOTE — Preoperative (Signed)
Beta Blockers   Reason not to administer Beta Blockers:Not Applicable 

## 2012-04-06 NOTE — Transfer of Care (Signed)
Immediate Anesthesia Transfer of Care Note  Patient: [name] [name]  Procedure(s) Performed: Procedure(s) (LRB): ESOPHAGOGASTRODUODENOSCOPY (EGD) WITH PROPOFOL (N/A) ESOPHAGEAL ENDOSCOPIC ULTRASOUND (EUS) RADIAL (N/A)  Patient Location: PACU and Endoscopy Unit  Anesthesia Type: MAC  Level of Consciousness: awake and patient cooperative  Airway & Oxygen Therapy: Patient Spontanous Breathing and Patient connected to nasal cannula oxygen  Post-op Assessment: Report given to PACU RN and Post -op Vital signs reviewed and stable  Post vital signs: Reviewed and stable  Complications: No apparent anesthesia complications

## 2012-04-06 NOTE — H&P (Signed)
Patient interval history reviewed.  Patient examined again.  There has been no change from documented H/P dated 03/18/12 (scanned into chart from our office) except as documented above.  Assessment: 1.  Right upper quadrant pain. 2.  Diarrhea. 3.  Elevated liver tests by patient report.  Plan: 1.  Endoscopy. 2.  Endoscopic ultrasound. 3.  Risks (bleeding, infection, bowel perforation that could require surgery, sedation-related changes in cardiopulmonary systems), benefits (identification and possible treatment of source of symptoms, exclusion of certain causes of symptoms), and alternatives (watchful waiting, radiographic imaging studies, empiric medical treatment) of upper endoscopy and endoscopic ultrasound (EGD +/- EUS) were explained to patient/family in detail and she wishes to proceed.

## 2012-04-06 NOTE — Anesthesia Postprocedure Evaluation (Signed)
  Anesthesia Post-op Note  Patient: [name] [name]  Procedure(s) Performed: Procedure(s) (LRB): ESOPHAGOGASTRODUODENOSCOPY (EGD) WITH PROPOFOL (N/A) ESOPHAGEAL ENDOSCOPIC ULTRASOUND (EUS) RADIAL (N/A)  Patient Location: PACU  Anesthesia Type: MAC  Level of Consciousness: awake and alert   Airway and Oxygen Therapy: Patient Spontanous Breathing  Post-op Pain: mild  Post-op Assessment: Post-op Vital signs reviewed, Patient's Cardiovascular Status Stable, Respiratory Function Stable, Patent Airway and No signs of Nausea or vomiting  Post-op Vital Signs: stable  Complications: No apparent anesthesia complications

## 2012-04-07 ENCOUNTER — Encounter (HOSPITAL_COMMUNITY): Payer: Self-pay | Admitting: Gastroenterology

## 2012-04-12 MED FILL — Lactated Ringer's Solution: INTRAVENOUS | Qty: 1000 | Status: AC

## 2012-12-16 ENCOUNTER — Other Ambulatory Visit: Payer: Self-pay | Admitting: Gastroenterology

## 2012-12-16 DIAGNOSIS — R1013 Epigastric pain: Secondary | ICD-10-CM

## 2012-12-16 DIAGNOSIS — R1011 Right upper quadrant pain: Secondary | ICD-10-CM

## 2012-12-21 ENCOUNTER — Ambulatory Visit
Admission: RE | Admit: 2012-12-21 | Discharge: 2012-12-21 | Disposition: A | Discharge status: Home / Self Care | Payer: Medicaid Other | Source: Ambulatory Visit | Attending: Gastroenterology | Admitting: Gastroenterology

## 2012-12-21 DIAGNOSIS — R1013 Epigastric pain: Secondary | ICD-10-CM

## 2012-12-21 DIAGNOSIS — R1011 Right upper quadrant pain: Secondary | ICD-10-CM

## 2012-12-21 MED ORDER — GADOXETATE DISODIUM 0.25 MMOL/ML IV SOLN
8.0000 mL | Freq: Once | INTRAVENOUS | Status: AC | PRN
  Administered 2012-12-21: 8 mL via INTRAVENOUS

## 2013-01-10 ENCOUNTER — Encounter: Payer: Self-pay | Admitting: *Deleted

## 2013-01-10 DIAGNOSIS — F329 Major depressive disorder, single episode, unspecified: Secondary | ICD-10-CM | POA: Insufficient documentation

## 2013-01-11 ENCOUNTER — Ambulatory Visit: Payer: Self-pay | Admitting: Obstetrics and Gynecology

## 2013-01-25 ENCOUNTER — Other Ambulatory Visit (HOSPITAL_COMMUNITY): Payer: Self-pay | Admitting: Gastroenterology

## 2013-01-26 ENCOUNTER — Other Ambulatory Visit: Payer: Self-pay | Admitting: Radiology

## 2013-01-30 ENCOUNTER — Encounter (HOSPITAL_COMMUNITY): Payer: Self-pay | Admitting: Respiratory Therapy

## 2013-01-31 ENCOUNTER — Ambulatory Visit (HOSPITAL_COMMUNITY)
Admission: RE | Admit: 2013-01-31 | Discharge: 2013-01-31 | Disposition: A | Discharge status: Home / Self Care | Payer: Medicaid Other | Source: Ambulatory Visit | Attending: Gastroenterology | Admitting: Gastroenterology

## 2013-01-31 ENCOUNTER — Encounter (HOSPITAL_COMMUNITY): Payer: Self-pay

## 2013-01-31 DIAGNOSIS — K7689 Other specified diseases of liver: Secondary | ICD-10-CM | POA: Insufficient documentation

## 2013-01-31 DIAGNOSIS — J4489 Other specified chronic obstructive pulmonary disease: Secondary | ICD-10-CM | POA: Insufficient documentation

## 2013-01-31 DIAGNOSIS — J449 Chronic obstructive pulmonary disease, unspecified: Secondary | ICD-10-CM | POA: Insufficient documentation

## 2013-01-31 DIAGNOSIS — F17200 Nicotine dependence, unspecified, uncomplicated: Secondary | ICD-10-CM | POA: Insufficient documentation

## 2013-01-31 DIAGNOSIS — F329 Major depressive disorder, single episode, unspecified: Secondary | ICD-10-CM | POA: Insufficient documentation

## 2013-01-31 DIAGNOSIS — F411 Generalized anxiety disorder: Secondary | ICD-10-CM | POA: Insufficient documentation

## 2013-01-31 DIAGNOSIS — F3289 Other specified depressive episodes: Secondary | ICD-10-CM | POA: Insufficient documentation

## 2013-01-31 DIAGNOSIS — R748 Abnormal levels of other serum enzymes: Secondary | ICD-10-CM | POA: Insufficient documentation

## 2013-01-31 DIAGNOSIS — K219 Gastro-esophageal reflux disease without esophagitis: Secondary | ICD-10-CM | POA: Insufficient documentation

## 2013-01-31 LAB — PROTIME-INR
INR: 0.98 (ref 0.00–1.49)
Prothrombin Time: 12.9 seconds (ref 11.6–15.2)

## 2013-01-31 LAB — CBC
HCT: 39.2 % (ref 36.0–46.0)
Hemoglobin: 13 g/dL (ref 12.0–15.0)
MCHC: 33.2 g/dL (ref 30.0–36.0)
RBC: 4.73 MIL/uL (ref 3.87–5.11)
WBC: 16.5 10*3/uL — ABNORMAL HIGH (ref 4.0–10.5)

## 2013-01-31 LAB — APTT: aPTT: 28 seconds (ref 24–37)

## 2013-01-31 MED ORDER — FENTANYL CITRATE 0.05 MG/ML IJ SOLN
INTRAMUSCULAR | Status: AC | PRN
  Administered 2013-01-31 (×3): 50 ug via INTRAVENOUS

## 2013-01-31 MED ORDER — MIDAZOLAM HCL 2 MG/2ML IJ SOLN
INTRAMUSCULAR | Status: AC | PRN
  Administered 2013-01-31 (×3): 1 mg via INTRAVENOUS

## 2013-01-31 MED ORDER — SODIUM CHLORIDE 0.9 % IV SOLN
Freq: Once | INTRAVENOUS | Status: AC
  Administered 2013-01-31: 09:00:00 via INTRAVENOUS

## 2013-01-31 MED ORDER — FENTANYL CITRATE 0.05 MG/ML IJ SOLN
INTRAMUSCULAR | Status: AC
  Filled 2013-01-31: qty 4

## 2013-01-31 MED ORDER — MIDAZOLAM HCL 2 MG/2ML IJ SOLN
INTRAMUSCULAR | Status: AC
  Filled 2013-01-31: qty 4

## 2013-01-31 NOTE — ED Notes (Signed)
Patient denies pain and is resting comfortably.  

## 2013-01-31 NOTE — ED Notes (Signed)
Family updated as to patient's status per MD at pt's bedside

## 2013-01-31 NOTE — H&P (Signed)
[name] [name] is an 42 y.o. female.   Chief Complaint: elevated liver functions for few yrs Increasing Scheduled for liver core biopsy  HPI: COPD; smoker; asthma; GERD  Past Medical History  Diagnosis Date  . COPD (chronic obstructive pulmonary disease)   . Asthma   . Anxiety   . Depression   . GERD (gastroesophageal reflux disease)   . Hx of gonorrhea     Past Surgical History  Procedure Laterality Date  . Cholecystectomy    . Tonsillectomy    . Cesarean section x 3    . Eus  04/06/2012    Procedure: ESOPHAGEAL ENDOSCOPIC ULTRASOUND (EUS) RADIAL;  Surgeon: Willis Modena, MD;  Location: [facility];  Service: Endoscopy;  Laterality: N/A;    Family History  Problem Relation Age of Onset  . Cancer Father     lung  . Spina bifida Brother    Social History:  reports that she has been smoking Cigarettes.  She has been smoking about 2.00 packs per day. She has never used smokeless tobacco. She reports that she does not drink alcohol or use illicit drugs.  Allergies:  Allergies  Allergen Reactions  . Codeine Nausea And Vomiting  . Sulfa Antibiotics Nausea And Vomiting     (Not in a hospital admission)  No results found for this or any previous visit (from the past 48 hour(s)). No results found.  Review of Systems  Constitutional: Negative for fever and weight loss.  Respiratory: Negative for cough.   Cardiovascular: Negative for chest pain.  Gastrointestinal: Negative for nausea, vomiting and abdominal pain.  Neurological: Negative for weakness and headaches.    Blood pressure 107/64, pulse 93, temperature 98.1 F (36.7 C), temperature source Oral, resp. rate 18, weight 192 lb (87.091 kg), last menstrual period 01/18/2013, SpO2 97.00%. Physical Exam  Constitutional: She is oriented to person, place, and time.  Cardiovascular: Normal rate, regular rhythm and normal heart sounds.   No murmur heard. Respiratory: Effort normal. She has wheezes.  GI: Soft. Bowel  sounds are normal. There is no tenderness.  Musculoskeletal: Normal range of motion.  Neurological: She is alert and oriented to person, place, and time.  Skin: Skin is warm and dry.  Psychiatric: She has a normal mood and affect. Her behavior is normal. Judgment and thought content normal.     Assessment/Plan Elevated liver fxn x 2-3 yrs Worsening Scheduled now for liver core bx Pt aware of procedure benefits and risks and agreeable to proceed Consent signed and in chart  [name] 01/31/2013, 9:23 AM

## 2013-01-31 NOTE — Procedures (Signed)
Procedure:  Ultrasound guided core biopsy of liver Findings:  Random 18 G core bx x 2 in right lobe of liver via 17 G needle.  No immediate complications.

## 2013-01-31 NOTE — H&P (Signed)
Agree 

## 2013-01-31 NOTE — ED Notes (Signed)
Patient denies pain and is resting comfortably with eyes closed 

## 2013-02-13 ENCOUNTER — Encounter: Payer: Self-pay | Admitting: Obstetrics and Gynecology

## 2013-02-13 ENCOUNTER — Ambulatory Visit (INDEPENDENT_AMBULATORY_CARE_PROVIDER_SITE_OTHER): Payer: Medicaid Other | Admitting: Obstetrics and Gynecology

## 2013-02-13 VITALS — BP 138/90 | Ht 61.0 in | Wt 191.0 lb

## 2013-02-13 DIAGNOSIS — N920 Excessive and frequent menstruation with regular cycle: Secondary | ICD-10-CM

## 2013-02-13 NOTE — Patient Instructions (Signed)
Levonorgestrel intrauterine device (IUD) What is this medicine? LEVONORGESTREL IUD (LEE voe nor jes trel) is a contraceptive (birth control) device. The device is placed inside the uterus by a healthcare professional. It is used to prevent pregnancy and can also be used to treat heavy bleeding that occurs during your period. Depending on the device, it can be used for 3 to 5 years. This medicine may be used for other purposes; ask your health care provider or pharmacist if you have questions. What should I tell my health care provider before I take this medicine? They need to know if you have any of these conditions: -abnormal Pap smear -cancer of the breast, uterus, or cervix -diabetes -endometritis -genital or pelvic infection now or in the past -have more than one sexual partner or your partner has more than one partner -heart disease -history of an ectopic or tubal pregnancy -immune system problems -IUD in place -liver disease or tumor -problems with blood clots or take blood-thinners -use intravenous drugs -uterus of unusual shape -vaginal bleeding that has not been explained -an unusual or allergic reaction to levonorgestrel, other hormones, silicone, or polyethylene, medicines, foods, dyes, or preservatives -pregnant or trying to get pregnant -breast-feeding How should I use this medicine? This device is placed inside the uterus by a health care professional. Talk to your pediatrician regarding the use of this medicine in children. Special care may be needed. Overdosage: If you think you have taken too much of this medicine contact a poison control center or emergency room at once. NOTE: This medicine is only for you. Do not share this medicine with others. What if I miss a dose? This does not apply. What may interact with this medicine? Do not take this medicine with any of the following medications: -amprenavir -bosentan -fosamprenavir This medicine may also interact with  the following medications: -aprepitant -barbiturate medicines for inducing sleep or treating seizures -bexarotene -griseofulvin -medicines to treat seizures like carbamazepine, ethotoin, felbamate, oxcarbazepine, phenytoin, topiramate -modafinil -pioglitazone -rifabutin -rifampin -rifapentine -some medicines to treat HIV infection like atazanavir, indinavir, lopinavir, nelfinavir, tipranavir, ritonavir -St. Radha Coggins's wort -warfarin This list may not describe all possible interactions. Give your health care provider a list of all the medicines, herbs, non-prescription drugs, or dietary supplements you use. Also tell them if you smoke, drink alcohol, or use illegal drugs. Some items may interact with your medicine. What should I watch for while using this medicine? Visit your doctor or health care professional for regular check ups. See your doctor if you or your partner has sexual contact with others, becomes HIV positive, or gets a sexual transmitted disease. This product does not protect you against HIV infection (AIDS) or other sexually transmitted diseases. You can check the placement of the IUD yourself by reaching up to the top of your vagina with clean fingers to feel the threads. Do not pull on the threads. It is a good habit to check placement after each menstrual period. Call your doctor right away if you feel more of the IUD than just the threads or if you cannot feel the threads at all. The IUD may come out by itself. You may become pregnant if the device comes out. If you notice that the IUD has come out use a backup birth control method like condoms and call your health care provider. Using tampons will not change the position of the IUD and are okay to use during your period. What side effects may I notice from receiving this medicine?   Side effects that you should report to your doctor or health care professional as soon as possible: -allergic reactions like skin rash, itching or  hives, swelling of the face, lips, or tongue -fever, flu-like symptoms -genital sores -high blood pressure -no menstrual period for 6 weeks during use -pain, swelling, warmth in the leg -pelvic pain or tenderness -severe or sudden headache -signs of pregnancy -stomach cramping -sudden shortness of breath -trouble with balance, talking, or walking -unusual vaginal bleeding, discharge -yellowing of the eyes or skin Side effects that usually do not require medical attention (report to your doctor or health care professional if they continue or are bothersome): -acne -breast pain -change in sex drive or performance -changes in weight -cramping, dizziness, or faintness while the device is being inserted -headache -irregular menstrual bleeding within first 3 to 6 months of use -nausea This list may not describe all possible side effects. Call your doctor for medical advice about side effects. You may report side effects to FDA at 1-800-FDA-1088. Where should I keep my medicine? This does not apply. NOTE: This sheet is a summary. It may not cover all possible information. If you have questions about this medicine, talk to your doctor, pharmacist, or health care provider.  2013, [employer]. (08/27/2011 1:54:04 PM)  

## 2013-02-13 NOTE — Progress Notes (Signed)
Patient ID: ADAMARYS SHALL, female   DOB: 02-17-1971, 42 y.o.   MRN: [id number] Patient complains of heavy periods with clots and abdominal pain.    [facility] Visit  Patient name: [name] [name] MRN [id number]  Date of birth: 1971-03-17  CC & HPI:  [name] [name] is a 42 y.o. female presenting today for heavy periods, 5 days with clots.   ROS:  G3 P3004, twins with  AUTISM,  Age 80, require full care at home by pt.   Pertinent History Reviewed:  Medical & Surgical Hx:  Reviewed: Significant for BTL. Cesarean section x 3. Medications: Reviewed & Updated - see associated section Social History: Reviewed -  reports that she has been smoking Cigarettes.  She has been smoking about 2.00 packs per day. She has never used smokeless tobacco.  Objective Findings:  Vitals: BP 138/90  Ht 5\' 1"  (1.549 m)  Wt 191 lb (86.637 kg)  BMI 36.11 kg/m2  LMP 02/10/2013  Physical Examination: Physical Examination: General appearance - alert, well appearing, and in no distress, oriented to person, place, and time and overweight Mental status - alert, oriented to person, place, and time, normal mood, behavior, speech, dress, motor activity, and thought processes, depressed mood Eyes - pupils equal and reactive, extraocular eye movements intact Neck - supple, no significant adenopathy Chest - clear to auscultation, no wheezes, rales or rhonchi, symmetric air entry Abdomen - soft, nontender, nondistended, no masses or organomegaly Pelvic - normal external genitalia, vulva, vagina, cervix, uterus and adnexa, exam limited by weight Rectal -   / body habitus.   Assessment & Plan:  Menorrhagia Pelvic u/s  Given info on IUD< Endo ablation

## 2013-02-17 ENCOUNTER — Ambulatory Visit (INDEPENDENT_AMBULATORY_CARE_PROVIDER_SITE_OTHER): Payer: Medicaid Other | Admitting: Obstetrics and Gynecology

## 2013-02-17 ENCOUNTER — Encounter: Payer: Self-pay | Admitting: Obstetrics and Gynecology

## 2013-02-17 ENCOUNTER — Ambulatory Visit (INDEPENDENT_AMBULATORY_CARE_PROVIDER_SITE_OTHER): Payer: Medicaid Other

## 2013-02-17 VITALS — BP 110/70 | Ht 61.0 in | Wt 190.1 lb

## 2013-02-17 DIAGNOSIS — N920 Excessive and frequent menstruation with regular cycle: Secondary | ICD-10-CM

## 2013-02-17 DIAGNOSIS — N924 Excessive bleeding in the premenopausal period: Secondary | ICD-10-CM

## 2013-02-17 MED ORDER — MEDROXYPROGESTERONE ACETATE 10 MG PO TABS: 10.0000 mg | ORAL_TABLET | Freq: Every day | ORAL | Status: DC

## 2013-02-17 NOTE — Patient Instructions (Addendum)
Endometrial Ablation Endometrial ablation removes the lining of the uterus (endometrium). It is usually a same day, outpatient treatment. Ablation helps avoid major surgery (such as a hysterectomy). A hysterectomy is removal of the cervix and uterus. Endometrial ablation has less risk and complications, has a shorter recovery period and is less expensive. After endometrial ablation, most women will have little or no menstrual bleeding. You may not keep your fertility. Pregnancy is no longer likely after this procedure but if you are pre-menopausal, you still need to use a reliable method of birth control following the procedure because pregnancy can occur. REASONS TO HAVE THE PROCEDURE MAY INCLUDE:  Heavy periods.  Bleeding that is causing anemia.  Anovulatory bleeding, very irregular, bleeding.  Bleeding submucous fibroids (on the lining inside the uterus) if they are smaller than 3 centimeters. REASONS NOT TO HAVE THE PROCEDURE MAY INCLUDE:  You wish to have more children.  You have a pre-cancerous or cancerous problem. The cause of any abnormal bleeding must be diagnosed before having the procedure.  You have pain coming from the uterus.  You have a submucus fibroid larger than 3 centimeters.  You recently had a baby.  You recently had an infection in the uterus.  You have a severe retro-flexed, tipped uterus and cannot insert the instrument to do the ablation.  You had a Cesarean section or deep major surgery on the uterus.  The inner cavity of the uterus is too large for the endometrial ablation instrument. RISKS AND COMPLICATIONS   Perforation of the uterus.  Bleeding.  Infection of the uterus, bladder or vagina.  Injury to surrounding organs.  Cutting the cervix.  An air bubble to the lung (air embolus).  Pregnancy following the procedure.  Failure of the procedure to help the problem requiring hysterectomy.  Decreased ability to diagnose cancer in the lining of  the uterus. BEFORE THE PROCEDURE  The lining of the uterus must be tested to make sure there is no pre-cancerous or cancer cells present.  Medications may be given to make the lining of the uterus thinner.  Ultrasound may be used to evaluate the size and look for abnormalities of the uterus.  Future pregnancy is not desired. PROCEDURE  There are different ways to destroy the lining of the uterus.   Resectoscope - radio frequency-alternating electric current is the most common one used.  Cryotherapy - freezing the lining of the uterus.  Heated Free Liquid - heated salt (saline) solution inserted into the uterus.  Microwave - uses high energy microwaves in the uterus.  Thermal Balloon - a catheter with a balloon tip is inserted into the uterus and filled with heated fluid. Your caregiver will talk with you about the method used in this clinic. They will also instruct you on the pros and cons of the procedure. Endometrial ablation is performed along with a procedure called operative hysteroscopy. A narrow viewing tube is inserted through the birth canal (vagina) and through the cervix into the uterus. A tiny camera attached to the viewing tube (hysteroscope) allows the uterine cavity to be shown on a TV monitor during surgery. Your uterus is filled with a harmless liquid to make the procedure easier. The lining of the uterus is then removed. The lining can also be removed with a resectoscope which allows your surgeon to cut away the lining of the uterus under direct vision. Usually, you will be able to go home within an hour after the procedure. HOME CARE INSTRUCTIONS   Do   not drive for 24 hours.  No tampons, douching or intercourse for 2 weeks or until your caregiver approves.  Rest at home for 24 to 48 hours. You may then resume normal activities unless told differently by your caregiver.  Take your temperature two times a day for 4 days, and record it.  Take any medications your  caregiver has ordered, as directed.  Use some form of contraception if you are pre-menopausal and do not want to get pregnant. Bleeding after the procedure is normal. It varies from light spotting and mildly watery to bloody discharge for 4 to 6 weeks. You may also have mild cramping. Only take over-the-counter or prescription medicines for pain, discomfort, or fever as directed by your caregiver. Do not use aspirin, as this may aggravate bleeding. Frequent urination during the first 24 hours is normal. You will not know how effective your surgery is until at least 3 months after the surgery. SEEK IMMEDIATE MEDICAL CARE IF:   Bleeding is heavier than a normal menstrual cycle.  An oral temperature above 102 F (38.9 C) develops.  You have increasing cramps or pains not relieved with medication or develop belly (abdominal) pain which does not seem to be related to the same area of earlier cramping and pain.  You are light headed, weak or have fainting episodes.  You develop pain in the shoulder strap areas.  You have chest or leg pain.  You have abnormal vaginal discharge.  You have painful urination. Document Released: 06/05/2004 Document Revised: 10/19/2011 Document Reviewed: 09/03/2007 [employer] Patient Information 2014 [employer].  

## 2013-02-17 NOTE — Progress Notes (Signed)
Patient ID: [name] [name], female   DOB: Apr 23, 1971, 42 y.o.   MRN: [id number] Pt here today for [place] and then go over results with Dr. Emelda Fear.   ultrasound reviewed and discussed ;with the patient , who desires to proceed toward endometrial ablation over IUD. Will need endometrial bx at time of ablation, or in advance. Will schedule for Hysteroscopy D&C endometrial ablation. Pt to be contacted by Alvis Lemmings Little for scheduling.

## 2013-02-20 ENCOUNTER — Encounter (HOSPITAL_COMMUNITY): Payer: Self-pay | Admitting: Pharmacy Technician

## 2013-02-20 NOTE — Progress Notes (Signed)
Pt scheduled for her procedure at [facility] on Tuesday, July 22 by Dr. Emelda Fear.

## 2013-02-21 ENCOUNTER — Other Ambulatory Visit: Payer: Self-pay | Admitting: Obstetrics and Gynecology

## 2013-02-21 ENCOUNTER — Encounter (HOSPITAL_COMMUNITY): Payer: Self-pay

## 2013-02-21 ENCOUNTER — Encounter (HOSPITAL_COMMUNITY)
Admission: RE | Admit: 2013-02-21 | Discharge: 2013-02-21 | Disposition: A | Discharge status: Home / Self Care | Payer: Medicaid Other | Source: Ambulatory Visit | Attending: Obstetrics and Gynecology | Admitting: Obstetrics and Gynecology

## 2013-02-21 HISTORY — DX: Anemia, unspecified: D64.9

## 2013-02-21 HISTORY — DX: Shortness of breath: R06.02

## 2013-02-21 LAB — BASIC METABOLIC PANEL
BUN: 3 mg/dL — ABNORMAL LOW (ref 6–23)
CO2: 25 mEq/L (ref 19–32)
Calcium: 9.5 mg/dL (ref 8.4–10.5)
Chloride: 102 mEq/L (ref 96–112)
Creatinine, Ser: 0.62 mg/dL (ref 0.50–1.10)
Glucose, Bld: 123 mg/dL — ABNORMAL HIGH (ref 70–99)

## 2013-02-21 LAB — CBC
HCT: 41.4 % (ref 36.0–46.0)
MCH: 27.9 pg (ref 26.0–34.0)
MCHC: 32.9 g/dL (ref 30.0–36.0)
MCV: 84.8 fL (ref 78.0–100.0)
RDW: 16.3 % — ABNORMAL HIGH (ref 11.5–15.5)
WBC: 15.6 10*3/uL — ABNORMAL HIGH (ref 4.0–10.5)

## 2013-02-21 LAB — URINALYSIS, ROUTINE W REFLEX MICROSCOPIC
Bilirubin Urine: NEGATIVE
Glucose, UA: NEGATIVE mg/dL
Hgb urine dipstick: NEGATIVE
Protein, ur: NEGATIVE mg/dL

## 2013-02-21 NOTE — Patient Instructions (Signed)
LARONICA BHAGAT  02/21/2013   Your procedure is scheduled on:  02/28/2013  Report to [facility] at  615  AM.  Call this number if you have problems the morning of surgery: [phone]   Remember:   Do not eat food or drink liquids after midnight.   Take these medicines the morning of surgery with A SIP OF WATER: xanax, abilify, nexium, ultram   Do not wear jewelry, make-up or nail polish.  Do not wear lotions, powders, or perfumes.  Do not shave 48 hours prior to surgery. Men may shave face and neck.  Do not bring valuables to the hospital.  [facility] is not responsible for any belongings or valuables.  Contacts, dentures or bridgework may not be worn into surgery.  Leave suitcase in the car. After surgery it may be brought to your room.  For patients admitted to the hospital, checkout time is 11:00 AM the day of discharge.   Patients discharged the day of surgery will not be allowed to drive  home.  Name and phone number of your driver: family  Special Instructions: N/A   Please read over the following fact sheets that you were given: Pain Booklet, Coughing and Deep Breathing, MRSA Information, Surgical Site Infection Prevention, Anesthesia Post-op Instructions and Care and Recovery After Surgery Endometrial Ablation Endometrial ablation removes the lining of the uterus (endometrium). It is usually a same day, outpatient treatment. Ablation helps avoid major surgery (such as a hysterectomy). A hysterectomy is removal of the cervix and uterus. Endometrial ablation has less risk and complications, has a shorter recovery period and is less expensive. After endometrial ablation, most women will have little or no menstrual bleeding. You may not keep your fertility. Pregnancy is no longer likely after this procedure but if you are pre-menopausal, you still need to use a reliable method of birth control following the procedure because pregnancy can occur. REASONS TO HAVE THE  PROCEDURE MAY INCLUDE:  Heavy periods.  Bleeding that is causing anemia.  Anovulatory bleeding, very irregular, bleeding.  Bleeding submucous fibroids (on the lining inside the uterus) if they are smaller than 3 centimeters. REASONS NOT TO HAVE THE PROCEDURE MAY INCLUDE:  You wish to have more children.  You have a pre-cancerous or cancerous problem. The cause of any abnormal bleeding must be diagnosed before having the procedure.  You have pain coming from the uterus.  You have a submucus fibroid larger than 3 centimeters.  You recently had a baby.  You recently had an infection in the uterus.  You have a severe retro-flexed, tipped uterus and cannot insert the instrument to do the ablation.  You had a Cesarean section or deep major surgery on the uterus.  The inner cavity of the uterus is too large for the endometrial ablation instrument. RISKS AND COMPLICATIONS   Perforation of the uterus.  Bleeding.  Infection of the uterus, bladder or vagina.  Injury to surrounding organs.  Cutting the cervix.  An air bubble to the lung (air embolus).  Pregnancy following the procedure.  Failure of the procedure to help the problem requiring hysterectomy.  Decreased ability to diagnose cancer in the lining of the uterus. BEFORE THE PROCEDURE  The lining of the uterus must be tested to make sure there is no pre-cancerous or cancer cells present.  Medications may be given to make the lining of the uterus thinner.  Ultrasound may be used to evaluate the size  and look for abnormalities of the uterus.  Future pregnancy is not desired. PROCEDURE  There are different ways to destroy the lining of the uterus.   Resectoscope - radio frequency-alternating electric current is the most common one used.  Cryotherapy - freezing the lining of the uterus.  Heated Free Liquid - heated salt (saline) solution inserted into the uterus.  Microwave - uses high energy microwaves in the  uterus.  Thermal Balloon - a catheter with a balloon tip is inserted into the uterus and filled with heated fluid. Your caregiver will talk with you about the method used in this clinic. They will also instruct you on the pros and cons of the procedure. Endometrial ablation is performed along with a procedure called operative hysteroscopy. A narrow viewing tube is inserted through the birth canal (vagina) and through the cervix into the uterus. A tiny camera attached to the viewing tube (hysteroscope) allows the uterine cavity to be shown on a TV monitor during surgery. Your uterus is filled with a harmless liquid to make the procedure easier. The lining of the uterus is then removed. The lining can also be removed with a resectoscope which allows your surgeon to cut away the lining of the uterus under direct vision. Usually, you will be able to go home within an hour after the procedure. HOME CARE INSTRUCTIONS   Do not drive for 24 hours.  No tampons, douching or intercourse for 2 weeks or until your caregiver approves.  Rest at home for 24 to 48 hours. You may then resume normal activities unless told differently by your caregiver.  Take your temperature two times a day for 4 days, and record it.  Take any medications your caregiver has ordered, as directed.  Use some form of contraception if you are pre-menopausal and do not want to get pregnant. Bleeding after the procedure is normal. It varies from light spotting and mildly watery to bloody discharge for 4 to 6 weeks. You may also have mild cramping. Only take over-the-counter or prescription medicines for pain, discomfort, or fever as directed by your caregiver. Do not use aspirin, as this may aggravate bleeding. Frequent urination during the first 24 hours is normal. You will not know how effective your surgery is until at least 3 months after the surgery. SEEK IMMEDIATE MEDICAL CARE IF:   Bleeding is heavier than a normal menstrual  cycle.  An oral temperature above 102 F (38.9 C) develops.  You have increasing cramps or pains not relieved with medication or develop belly (abdominal) pain which does not seem to be related to the same area of earlier cramping and pain.  You are light headed, weak or have fainting episodes.  You develop pain in the shoulder strap areas.  You have chest or leg pain.  You have abnormal vaginal discharge.  You have painful urination. Document Released: 06/05/2004 Document Revised: 10/19/2011 Document Reviewed: 09/03/2007 [facility] Patient Information 2014 [place], [place]. Hysteroscopy Hysteroscopy is a procedure used for looking inside the womb (uterus). It may be done for many different reasons, including:  To evaluate abnormal bleeding, fibroid (benign, noncancerous) tumors, polyps, scar tissue (adhesions), and possibly cancer of the uterus.  To look for lumps (tumors) and other uterine growths.  To look for causes of why a woman cannot get pregnant (infertility), causes of recurrent loss of pregnancy (miscarriages), or a lost intrauterine device (IUD).  To perform a sterilization by blocking the fallopian tubes from inside the uterus. A hysteroscopy should be done  right after a menstrual period to be sure you are not pregnant. LET YOUR CAREGIVER KNOW ABOUT:   Allergies.  Medicines taken, including herbs, eyedrops, over-the-counter medicines, and creams.  Use of steroids (by mouth or creams).  Previous problems with anesthetics or numbing medicines.  History of bleeding or blood problems.  History of blood clots.  Possibility of pregnancy, if this applies.  Previous surgery.  Other health problems. RISKS AND COMPLICATIONS   Putting a hole in the uterus.  Excessive bleeding.  Infection.  Damage to the cervix.  Injury to other organs.  Allergic reaction to medicines.  Too much fluid used in the uterus for the procedure. BEFORE THE PROCEDURE   Do not  take aspirin or blood thinners for a week before the procedure, or as directed. It can cause bleeding.  Arrive at least 60 minutes before the procedure or as directed to read and sign the necessary forms.  Arrange for someone to take you home after the procedure.  If you smoke, do not smoke for 2 weeks before the procedure. PROCEDURE   Your caregiver may give you medicine to relax you. He or she may also give you a medicine that numbs the area around the cervix (local anesthetic) or a medicine that makes you sleep (general anesthesia).  Sometimes, a medicine is placed in the cervix the day before the procedure. This medicine makes the cervix have a larger opening (dilate). This makes it easier for the instrument to be inserted into the uterus.  A small instrument (hysteroscope) is inserted through the vagina into the uterus. This instrument is similar to a pencil-sized telescope with a light.  During the procedure, air or a liquid is put into the uterus, which allows the surgeon to see better.  Sometimes, tissue is gently scraped from inside the uterus. These tissue samples are sent to a specialist who looks at tissue samples (pathologist). The pathologist will give a report to your caregiver. This will help your caregiver decide if further treatment is necessary. The report will also help your caregiver decide on the best treatment if the test comes back abnormal. AFTER THE PROCEDURE   If you had a general anesthetic, you may be groggy for a couple hours after the procedure.  If you had a local anesthetic, you will be advised to rest at the surgical center or caregiver's office until you are stable and feel ready to go home.  You may have some cramping for a couple days.  You may have bleeding, which varies from light spotting for a few days to menstrual-like bleeding for up to 3 to 7 days. This is normal.  Have someone take you home. FINDING OUT THE RESULTS OF YOUR TEST Not all test  results are available during your visit. If your test results are not back during the visit, make an appointment with your caregiver to find out the results. Do not assume everything is normal if you have not heard from your caregiver or the medical facility. It is important for you to follow up on all of your test results. HOME CARE INSTRUCTIONS   Do not drive for 24 hours or as instructed.  Only take over-the-counter or prescription medicines for pain, discomfort, or fever as directed by your caregiver.  Do not take aspirin. It can cause or aggravate bleeding.  Do not drive or drink alcohol while taking pain medicine.  You may resume your usual diet.  Do not use tampons, douche, or have sexual intercourse  for 2 weeks, or as advised by your caregiver.  Rest and sleep for the first 24 to 48 hours.  Take your temperature twice a day for 4 to 5 days. Write it down. Give these temperatures to your caregiver if they are abnormal (above 98.6 F or 37.0 C).  Take medicines your caregiver has ordered as directed.  Follow your caregiver's advice regarding diet, exercise, lifting, driving, and general activities.  Take showers instead of baths for 2 weeks, or as recommended by your caregiver.  If you develop constipation:  Take a mild laxative with the advice of your caregiver.  Eat bran foods.  Drink enough water and fluids to keep your urine clear or pale yellow.  Try to have someone with you or available to you for the first 24 to 48 hours, especially if you had a general anesthetic.  Make sure you and your family understand everything about your operation and recovery.  Follow your caregiver's advice regarding follow-up appointments and Pap smears. SEEK MEDICAL CARE IF:   You feel dizzy or lightheaded.  You feel sick to your stomach (nauseous).  You develop abnormal vaginal discharge.  You develop a rash.  You have an abnormal reaction or allergy to your medicine.  You  need stronger pain medicine. SEEK IMMEDIATE MEDICAL CARE IF:   Bleeding is heavier than a normal menstrual period or you have blood clots.  You have an oral temperature above 102 F (38.9 C), not controlled by medicine.  You have increasing cramps or pains not relieved with medicine.  You develop belly (abdominal) pain that does not seem to be related to the same area of earlier cramping and pain.  You pass out.  You develop pain in the tops of your shoulders (shoulder strap areas).  You develop shortness of breath. MAKE SURE YOU:   Understand these instructions.  Will watch your condition.  Will get help right away if you are not doing well or get worse. Document Released: 11/02/2000 Document Revised: 10/19/2011 Document Reviewed: 02/25/2009 [facility] Patient Information 2014 [place], [place]. Dilation and Curettage or Vacuum Curettage Dilation and curettage (D&C) and vacuum curettage are minor procedures. A D&C involves stretching (dilation) the cervix and scraping (curettage) the inside lining of the womb (uterus). During a D&C, tissue is gently scraped from the inside lining of the uterus. During a vacuum curettage, the lining and tissue in the uterus are removed with the use of gentle suction. Curettage may be performed for diagnostic or therapeutic purposes. As a diagnostic procedure, curettage is performed for the purpose of examining tissues from the uterus. Tissue examination may help determine causes or treatment options for symptoms. A diagnostic curettage may be performed for the following symptoms:  Irregular bleeding in the uterus.  Bleeding with the development of clots.  Spotting between menstrual periods.  Prolonged menstrual periods.  Bleeding after menopause.  No menstrual period (amenorrhea).  A change in size and shape of the uterus. A therapeutic curettage is performed to remove tissue, blood, or a contraceptive device. Therapeutic curettage may be  performed for the following conditions:   Removal of an IUD (intrauterine device).  Removal of retained placenta after giving birth. Retained placenta can cause bleeding severe enough to require transfusions or an infection.  Abortion.  Miscarriage.  Removal of polyps inside the uterus.  Removal of uncommon types of fibroids (noncancerous lumps). LET YOUR CAREGIVER KNOW ABOUT:   Allergies to food or medicine.  Medicines taken, including vitamins, herbs, eyedrops, over-the-counter medicines,  and creams.  Use of steroids (by mouth or creams).  Previous problems with anesthetics or numbing medicines.  History of bleeding problems or blood clots.  Previous surgery.  Other health problems, including diabetes and kidney problems.  Possibility of pregnancy, if this applies. RISKS AND COMPLICATIONS   Excessive bleeding.  Infection of the uterus.  Damage to the cervix.  Development of scar tissue (adhesions) inside the uterus, later causing abnormal amounts of menstrual bleeding.  Complications from the general anesthetic, if a general anesthetic is used.  Putting a hole (perforation) in the uterus. This is rare. BEFORE THE PROCEDURE   Eat and drink before the procedure only as directed by your caregiver.  Arrange for someone to take you home. PROCEDURE   This procedure may be done in a hospital, outpatient clinic, or caregiver's office.  You may be given a general anesthetic or a local anesthetic in and around the cervix.  You will lie on your back with your legs in stirrups.  There are two ways in which your cervix can be softened and dilated. These include:  Taking a medicine.  Having thin rods (laminaria) inserted into your cervix.  A curved tool (curette) will scrape cells from the inside lining of the uterus and will then be removed. This procedure usually takes about 15 to 30 minutes. AFTER THE PROCEDURE   You will rest in the recovery area until you  are stable and are ready to go home.  You will need to have someone take you home.  You may feel sick to your stomach (nauseous) or throw up (vomit) if you had general anesthesia.  You may have a sore throat if a tube was placed in your throat during general anesthesia.  You may have light cramping and bleeding for 2 days to 2 weeks after the procedure.  Your uterus needs to make a new lining after the procedure. This may make your next period late. Document Released: 07/27/2005 Document Revised: 10/19/2011 Document Reviewed: 02/22/2009 [facility] Patient Information 2014 [place], [place]. PATIENT INSTRUCTIONS POST-ANESTHESIA  IMMEDIATELY FOLLOWING SURGERY:  Do not drive or operate machinery for the first twenty four hours after surgery.  Do not make any important decisions for twenty four hours after surgery or while taking narcotic pain medications or sedatives.  If you develop intractable nausea and vomiting or a severe headache please notify your doctor immediately.  FOLLOW-UP:  Please make an appointment with your surgeon as instructed. You do not need to follow up with anesthesia unless specifically instructed to do so.  WOUND CARE INSTRUCTIONS (if applicable):  Keep a dry clean dressing on the anesthesia/puncture wound site if there is drainage.  Once the wound has quit draining you may leave it open to air.  Generally you should leave the bandage intact for twenty four hours unless there is drainage.  If the epidural site drains for more than 36-48 hours please call the anesthesia department.  QUESTIONS?:  Please feel free to call your physician or the hospital operator if you have any questions, and they will be happy to assist you.

## 2013-02-27 NOTE — H&P (Signed)
  Patient name: [name] [name] MRN [id number] Date of birth: [birth year]  CC & HPI:   [name] [name] is a 42 y.o. female presenting today for heavy periods, 5 days with clots. Pelvic ultrasound done 02/17/13 shows a thin endometrium 5 mm thick . Will send curettings at time of procedure, as pt aversive to pain of biopsy, and thin liniing indicates benign character. ROS:   G3 P3004, twins with AUTISM, Age 57, require full care at home by pt.  Pertinent History Reviewed:   Medical & Surgical Hx: Reviewed: Significant for BTL. Cesarean section x 3.  Medications: Reviewed & Updated - see associated section  Social History: Reviewed - reports that she has been smoking Cigarettes. She has been smoking about 2.00 packs per day. She has never used smokeless tobacco.  Objective Findings:   Vitals: BP 138/90  Ht 5\' 1"  (1.549 m)  Wt 191 lb (86.637 kg)  BMI 36.11 kg/m2  LMP 02/10/2013  Physical Examination: Physical Examination: General appearance - alert, well appearing, and in no distress, oriented to person, place, and time and overweight  Mental status - alert, oriented to person, place, and time, normal mood, behavior, speech, dress, motor activity, and thought processes, depressed mood  Eyes - pupils equal and reactive, extraocular eye movements intact  Neck - supple, no significant adenopathy  Chest - clear to auscultation, no wheezes, rales or rhonchi, symmetric air entry  Abdomen - soft, nontender, nondistended, no masses or organomegaly  Pelvic - normal external genitalia, vulva, vagina, cervix, uterus and adnexa, exam limited by weight  Rectal -  / body habitus.  Assessment & Plan:   Menorrhagia,  Plan: hysteroscopy , Dilation and curettage , endometrial ablation. Procedure reviewed at length with pt using [employer] Brochures, with review of risks and usual success rates of satisfactory response(90%), and amenorrhea (45%).

## 2013-02-28 ENCOUNTER — Encounter (HOSPITAL_COMMUNITY)
Admission: RE | Disposition: A | Discharge status: Home Health | Payer: Self-pay | Source: Ambulatory Visit | Attending: Obstetrics and Gynecology

## 2013-02-28 ENCOUNTER — Ambulatory Visit (HOSPITAL_COMMUNITY)
Admission: RE | Admit: 2013-02-28 | Discharge: 2013-02-28 | Disposition: A | Discharge status: Home Health | Payer: Medicaid Other | Source: Ambulatory Visit | Attending: Obstetrics and Gynecology | Admitting: Obstetrics and Gynecology

## 2013-02-28 ENCOUNTER — Encounter (HOSPITAL_COMMUNITY): Payer: Self-pay | Admitting: *Deleted

## 2013-02-28 ENCOUNTER — Encounter (HOSPITAL_COMMUNITY): Payer: Self-pay | Admitting: Anesthesiology

## 2013-02-28 ENCOUNTER — Ambulatory Visit (HOSPITAL_COMMUNITY): Payer: Medicaid Other | Admitting: Anesthesiology

## 2013-02-28 DIAGNOSIS — Z6836 Body mass index (BMI) 36.0-36.9, adult: Secondary | ICD-10-CM | POA: Insufficient documentation

## 2013-02-28 DIAGNOSIS — N920 Excessive and frequent menstruation with regular cycle: Secondary | ICD-10-CM | POA: Insufficient documentation

## 2013-02-28 DIAGNOSIS — J449 Chronic obstructive pulmonary disease, unspecified: Secondary | ICD-10-CM | POA: Insufficient documentation

## 2013-02-28 DIAGNOSIS — Z01812 Encounter for preprocedural laboratory examination: Secondary | ICD-10-CM | POA: Insufficient documentation

## 2013-02-28 DIAGNOSIS — Z01810 Encounter for preprocedural cardiovascular examination: Secondary | ICD-10-CM | POA: Insufficient documentation

## 2013-02-28 DIAGNOSIS — N921 Excessive and frequent menstruation with irregular cycle: Secondary | ICD-10-CM

## 2013-02-28 DIAGNOSIS — J4489 Other specified chronic obstructive pulmonary disease: Secondary | ICD-10-CM | POA: Insufficient documentation

## 2013-02-28 HISTORY — PX: DILITATION & CURRETTAGE/HYSTROSCOPY WITH THERMACHOICE ABLATION: SHX5569

## 2013-02-28 SURGERY — DILATATION & CURETTAGE/HYSTEROSCOPY WITH THERMACHOICE ABLATION
Anesthesia: General | Wound class: Clean Contaminated

## 2013-02-28 MED ORDER — MIDAZOLAM HCL 2 MG/2ML IJ SOLN
INTRAMUSCULAR | Status: AC
  Filled 2013-02-28: qty 2

## 2013-02-28 MED ORDER — CEFAZOLIN SODIUM-DEXTROSE 2-3 GM-% IV SOLR
2.0000 g | INTRAVENOUS | Status: AC
  Administered 2013-02-28: 2 g via INTRAVENOUS

## 2013-02-28 MED ORDER — GLYCOPYRROLATE 0.2 MG/ML IJ SOLN
INTRAMUSCULAR | Status: DC | PRN
  Administered 2013-02-28: 0.3 mg via INTRAVENOUS

## 2013-02-28 MED ORDER — ONDANSETRON HCL 4 MG/2ML IJ SOLN: 4.0000 mg | Freq: Once | INTRAMUSCULAR | Status: DC | PRN

## 2013-02-28 MED ORDER — FENTANYL CITRATE 0.05 MG/ML IJ SOLN
INTRAMUSCULAR | Status: AC
  Administered 2013-02-28: 50 ug
  Filled 2013-02-28: qty 2

## 2013-02-28 MED ORDER — LIDOCAINE HCL (CARDIAC) 20 MG/ML IV SOLN
INTRAVENOUS | Status: DC | PRN
  Administered 2013-02-28: 40 mg via INTRAVENOUS

## 2013-02-28 MED ORDER — DEXTROSE 5 % IV SOLN
INTRAVENOUS | Status: DC | PRN
  Administered 2013-02-28: 1

## 2013-02-28 MED ORDER — ONDANSETRON HCL 4 MG/2ML IJ SOLN
INTRAMUSCULAR | Status: AC
  Filled 2013-02-28: qty 2

## 2013-02-28 MED ORDER — PROPOFOL 10 MG/ML IV EMUL
INTRAVENOUS | Status: AC
  Filled 2013-02-28: qty 20

## 2013-02-28 MED ORDER — DEXAMETHASONE SODIUM PHOSPHATE 4 MG/ML IJ SOLN
4.0000 mg | Freq: Once | INTRAMUSCULAR | Status: AC
  Administered 2013-02-28: 4 mg via INTRAVENOUS

## 2013-02-28 MED ORDER — TRAMADOL HCL 50 MG PO TABS: 50.0000 mg | ORAL_TABLET | Freq: Four times a day (QID) | ORAL | Status: DC | PRN

## 2013-02-28 MED ORDER — FENTANYL CITRATE 0.05 MG/ML IJ SOLN
INTRAMUSCULAR | Status: AC
  Filled 2013-02-28: qty 5

## 2013-02-28 MED ORDER — DEXAMETHASONE SODIUM PHOSPHATE 4 MG/ML IJ SOLN
INTRAMUSCULAR | Status: AC
  Filled 2013-02-28: qty 1

## 2013-02-28 MED ORDER — ROCURONIUM BROMIDE 50 MG/5ML IV SOLN
INTRAVENOUS | Status: AC
  Filled 2013-02-28: qty 1

## 2013-02-28 MED ORDER — BUPIVACAINE-EPINEPHRINE PF 0.5-1:200000 % IJ SOLN
INTRAMUSCULAR | Status: AC
  Filled 2013-02-28: qty 10

## 2013-02-28 MED ORDER — NEOSTIGMINE METHYLSULFATE 1 MG/ML IJ SOLN
INTRAMUSCULAR | Status: DC | PRN
  Administered 2013-02-28: 2 mg via INTRAVENOUS

## 2013-02-28 MED ORDER — GLYCOPYRROLATE 0.2 MG/ML IJ SOLN
INTRAMUSCULAR | Status: AC
  Filled 2013-02-28: qty 1

## 2013-02-28 MED ORDER — LACTATED RINGERS IV SOLN: INTRAVENOUS | Status: DC

## 2013-02-28 MED ORDER — LACTATED RINGERS IV SOLN
INTRAVENOUS | Status: DC
  Administered 2013-02-28: 07:00:00 via INTRAVENOUS

## 2013-02-28 MED ORDER — GLYCOPYRROLATE 0.2 MG/ML IJ SOLN
0.2000 mg | Freq: Once | INTRAMUSCULAR | Status: AC
  Administered 2013-02-28: 0.2 mg via INTRAVENOUS

## 2013-02-28 MED ORDER — SODIUM CHLORIDE 0.9 % IR SOLN
Status: DC | PRN
  Administered 2013-02-28: 3000 mL

## 2013-02-28 MED ORDER — FENTANYL CITRATE 0.05 MG/ML IJ SOLN
25.0000 ug | INTRAMUSCULAR | Status: DC | PRN
  Administered 2013-02-28 (×2): 50 ug via INTRAVENOUS

## 2013-02-28 MED ORDER — BUPIVACAINE-EPINEPHRINE 0.5% -1:200000 IJ SOLN
INTRAMUSCULAR | Status: DC | PRN
  Administered 2013-02-28: 20 mL

## 2013-02-28 MED ORDER — CEFAZOLIN SODIUM-DEXTROSE 2-3 GM-% IV SOLR
INTRAVENOUS | Status: AC
  Filled 2013-02-28: qty 50

## 2013-02-28 MED ORDER — MIDAZOLAM HCL 2 MG/2ML IJ SOLN
1.0000 mg | INTRAMUSCULAR | Status: DC | PRN
  Administered 2013-02-28: 2 mg via INTRAVENOUS

## 2013-02-28 MED ORDER — KETOROLAC TROMETHAMINE 30 MG/ML IJ SOLN
30.0000 mg | Freq: Once | INTRAMUSCULAR | Status: AC
  Administered 2013-02-28: 30 mg via INTRAVENOUS

## 2013-02-28 MED ORDER — PROPOFOL 10 MG/ML IV BOLUS
INTRAVENOUS | Status: DC | PRN
  Administered 2013-02-28: 150 mg via INTRAVENOUS

## 2013-02-28 MED ORDER — KETOROLAC TROMETHAMINE 10 MG PO TABS: 10.0000 mg | ORAL_TABLET | Freq: Four times a day (QID) | ORAL | Status: DC | PRN

## 2013-02-28 MED ORDER — SUCCINYLCHOLINE CHLORIDE 20 MG/ML IJ SOLN
INTRAMUSCULAR | Status: DC | PRN
  Administered 2013-02-28: 120 mg via INTRAVENOUS

## 2013-02-28 MED ORDER — LIDOCAINE HCL (CARDIAC) 20 MG/ML IV SOLN: INTRAVENOUS | Status: DC | PRN

## 2013-02-28 MED ORDER — SUCCINYLCHOLINE CHLORIDE 20 MG/ML IJ SOLN
INTRAMUSCULAR | Status: AC
  Filled 2013-02-28: qty 1

## 2013-02-28 MED ORDER — LIDOCAINE HCL (PF) 1 % IJ SOLN
INTRAMUSCULAR | Status: AC
  Filled 2013-02-28: qty 5

## 2013-02-28 MED ORDER — ONDANSETRON HCL 4 MG/2ML IJ SOLN
4.0000 mg | Freq: Once | INTRAMUSCULAR | Status: AC
  Administered 2013-02-28: 4 mg via INTRAVENOUS

## 2013-02-28 MED ORDER — KETOROLAC TROMETHAMINE 30 MG/ML IJ SOLN
INTRAMUSCULAR | Status: AC
  Filled 2013-02-28: qty 1

## 2013-02-28 MED ORDER — FENTANYL CITRATE 0.05 MG/ML IJ SOLN
INTRAMUSCULAR | Status: DC | PRN
  Administered 2013-02-28 (×2): 50 ug via INTRAVENOUS

## 2013-02-28 MED ORDER — ROCURONIUM BROMIDE 100 MG/10ML IV SOLN
INTRAVENOUS | Status: DC | PRN
  Administered 2013-02-28: 5 mg via INTRAVENOUS
  Administered 2013-02-28: 20 mg via INTRAVENOUS

## 2013-02-28 SURGICAL SUPPLY — 27 items
BAG DECANTER FOR FLEXI CONT (MISCELLANEOUS) ×2 IMPLANT
BAG HAMPER (MISCELLANEOUS) ×2 IMPLANT
CATH ROBINSON RED A/P 16FR (CATHETERS) ×3 IMPLANT
CATH THERMACHOICE III (CATHETERS) ×2 IMPLANT
CLOTH BEACON ORANGE TIMEOUT ST (SAFETY) ×2 IMPLANT
COVER LIGHT HANDLE STERIS (MISCELLANEOUS) ×4 IMPLANT
FORMALIN 10 PREFIL 120ML (MISCELLANEOUS) ×2 IMPLANT
GLOVE BIOGEL PI IND STRL 7.0 (GLOVE) IMPLANT
GLOVE BIOGEL PI INDICATOR 7.0 (GLOVE) ×2
GLOVE ECLIPSE 6.5 STRL STRAW (GLOVE) ×1 IMPLANT
GLOVE ECLIPSE 9.0 STRL (GLOVE) ×2 IMPLANT
GLOVE EXAM NITRILE MD LF STRL (GLOVE) ×1 IMPLANT
GLOVE INDICATOR STER SZ 9 (GLOVE) ×2 IMPLANT
GOWN STRL REIN 3XL LVL4 (GOWN DISPOSABLE) ×2 IMPLANT
GOWN STRL REIN XL XLG (GOWN DISPOSABLE) ×4 IMPLANT
INST SET HYSTEROSCOPY (KITS) ×2 IMPLANT
IV D5W 500ML (IV SOLUTION) ×2 IMPLANT
IV NS IRRIG 3000ML ARTHROMATIC (IV SOLUTION) ×2 IMPLANT
KIT ROOM TURNOVER AP CYSTO (KITS) ×2 IMPLANT
MANIFOLD NEPTUNE II (INSTRUMENTS) ×2 IMPLANT
NS IRRIG 1000ML POUR BTL (IV SOLUTION) ×2 IMPLANT
PACK PERI GYN (CUSTOM PROCEDURE TRAY) ×2 IMPLANT
PAD ARMBOARD 7.5X6 YLW CONV (MISCELLANEOUS) ×2 IMPLANT
PAD TELFA 3X4 1S STER (GAUZE/BANDAGES/DRESSINGS) ×2 IMPLANT
SET BASIN LINEN APH (SET/KITS/TRAYS/PACK) ×2 IMPLANT
SET IRRIG Y TYPE TUR BLADDER L (SET/KITS/TRAYS/PACK) ×2 IMPLANT
SYR CONTROL 10ML LL (SYRINGE) ×2 IMPLANT

## 2013-02-28 NOTE — Brief Op Note (Signed)
02/28/2013  12:06 PM  PATIENT:  [name] [name]  42 y.o. female  PRE-OPERATIVE DIAGNOSIS:  menometorrhagia  POST-OPERATIVE DIAGNOSIS:  menometorrhagia  PROCEDURE:  Procedure(s) with comments: DILATATION & CURETTAGE/HYSTEROSCOPY WITH THERMACHOICE ABLATION (N/A) - Total Ablation Therapy Time = 9 minutes 31 seconds  SURGEON:  Surgeon(s) and Role:    * Tilda Burrow, MD - Primary  PHYSICIAN ASSISTANT:   ASSISTANTS: none   ANESTHESIA:   general and paracervical block  EBL:  Total I/O In: 600 [Blood:600] Out: -   BLOOD ADMINISTERED:none  DRAINS: none   LOCAL MEDICATIONS USED:  MARCAINE    and Amount: 20 ml  SPECIMEN:  Source of Specimen:  uterine curettings  DISPOSITION OF SPECIMEN:  PATHOLOGY  COUNTS:  YES  TOURNIQUET:  * No tourniquets in log *  DICTATION: .Dragon Dictation  PLAN OF CARE: Discharge to home after PACU  PATIENT DISPOSITION:  PACU - hemodynamically stable.   Delay start of Pharmacological VTE agent (>24hrs) due to surgical blood loss or risk of bleeding: not applicable

## 2013-02-28 NOTE — Op Note (Signed)
PATIENT:  [name] [name]  42 y.o. female  PRE-OPERATIVE DIAGNOSIS:  menometorrhagia  POST-OPERATIVE DIAGNOSIS:  menometorrhagia  PROCEDURE:  Procedure(s) with comments: DILATATION & CURETTAGE/HYSTEROSCOPY WITH THERMACHOICE ABLATION (N/A) - Total Ablation Therapy Time = 9 minutes 31 seconds  SURGEON:  Surgeon(s) Channell Quattrone The patient was taken to the OR, prepped, draped and timeout confirmed by surgical team. Ancef 2 mg administered. Speculum was inserted, cervix grasped, and uterus sounded to 11 cm in the anteflexed position, then dilated to 23 [place], allowing introduction of the rigid 30 degree hysteroscope, which confirmed both tubal ostia , and a smooth normal appearing endometrial cavity with slight endometrial tissue present.  The endometrial curetting was performed. Scanty tissue , and blood , were collected and sent for histology. The Gynecare thermachoice III ablation device was tested, inserted to 10 cm, and insufflated with 17 cc D5W, then the 8 minute ablation sequence completed, the 17 cc recovered , and device removed and paracervical block inserted at 3,5,7,and 9 o'clock around the cervix, using a total of 20 cc of anesthetic (Marcaine), and patient allowed to awaken and go to RR in good condition.

## 2013-02-28 NOTE — Anesthesia Postprocedure Evaluation (Signed)
  Anesthesia Post-op Note  Patient: [name] [name]  Procedure(s) Performed: Procedure(s) with comments: DILATATION & CURETTAGE/HYSTEROSCOPY WITH THERMACHOICE ABLATION (N/A) - Total Ablation Therapy Time = 9 minutes 31 seconds  Patient Location: PACU  Anesthesia Type:General  Level of Consciousness: awake, alert  and oriented  Airway and Oxygen Therapy: Patient Spontanous Breathing and Patient connected to face mask oxygen  Post-op Pain: none  Post-op Assessment: Post-op Vital signs reviewed, Patient's Cardiovascular Status Stable, Respiratory Function Stable, Patent Airway and No signs of Nausea or vomiting  Post-op Vital Signs: Reviewed and stable  Complications: No apparent anesthesia complications

## 2013-02-28 NOTE — Transfer of Care (Signed)
Immediate Anesthesia Transfer of Care Note  Patient: [name] [name]  Procedure(s) Performed: Procedure(s) with comments: DILATATION & CURETTAGE/HYSTEROSCOPY WITH THERMACHOICE ABLATION (N/A) - Total Ablation Therapy Time = 9 minutes 31 seconds  Patient Location: PACU  Anesthesia Type:General  Level of Consciousness: awake, alert  and oriented  Airway & Oxygen Therapy: Patient Spontanous Breathing and Patient connected to face mask oxygen  Post-op Assessment: Report given to PACU RN  Post vital signs: Reviewed and stable  Complications: No apparent anesthesia complications

## 2013-02-28 NOTE — Anesthesia Procedure Notes (Signed)
Procedure Name: Intubation Date/Time: 02/28/2013 7:45 AM Performed by: Glynn Octave E Pre-anesthesia Checklist: Patient identified, Patient being monitored, Timeout performed, Emergency Drugs available and Suction available Patient Re-evaluated:Patient Re-evaluated prior to inductionOxygen Delivery Method: Circle System Utilized Preoxygenation: Pre-oxygenation with 100% oxygen Intubation Type: IV induction, Rapid sequence and Cricoid Pressure applied Ventilation: Mask ventilation without difficulty Laryngoscope Size: Mac and 3 Grade View: Grade I Tube type: Oral Tube size: 7.0 mm Number of attempts: 1 Airway Equipment and Method: stylet Placement Confirmation: ETT inserted through vocal cords under direct vision,  positive ETCO2 and breath sounds checked- equal and bilateral Secured at: 21 cm Tube secured with: Tape Dental Injury: Teeth and Oropharynx as per pre-operative assessment

## 2013-02-28 NOTE — Anesthesia Preprocedure Evaluation (Signed)
Anesthesia Evaluation  Patient identified by MRN, date of birth, ID band Patient awake    Reviewed: Allergy & Precautions, H&P , NPO status , Patient's Chart, lab work & pertinent test results  Airway Mallampati: II TM Distance: >3 FB Neck ROM: full    Dental  (+) Dental Advisory Given and Missing,    Pulmonary neg pulmonary ROS, shortness of breath, asthma , COPD COPD inhaler, Current Smoker,  breath sounds clear to auscultation  Pulmonary exam normal       Cardiovascular Exercise Tolerance: Good negative cardio ROS  Rhythm:regular Rate:Normal     Neuro/Psych PSYCHIATRIC DISORDERS Anxiety Depression negative neurological ROS  negative psych ROS   GI/Hepatic negative GI ROS, Neg liver ROS, GERD-  Medicated and Controlled,  Endo/Other  negative endocrine ROS  Renal/GU negative Renal ROS  negative genitourinary   Musculoskeletal   Abdominal   Peds  Hematology negative hematology ROS (+)   Anesthesia Other Findings   Reproductive/Obstetrics negative OB ROS                           Anesthesia Physical Anesthesia Plan  ASA: III  Anesthesia Plan: General   Post-op Pain Management:    Induction: Intravenous, Rapid sequence and Cricoid pressure planned  Airway Management Planned: Oral ETT  Additional Equipment:   Intra-op Plan:   Post-operative Plan: Extubation in OR  Informed Consent: I have reviewed the patients History and Physical, chart, labs and discussed the procedure including the risks, benefits and alternatives for the proposed anesthesia with the patient or authorized representative who has indicated his/her understanding and acceptance.     Plan Discussed with:   Anesthesia Plan Comments:         Anesthesia Quick Evaluation

## 2013-02-28 NOTE — Interval H&P Note (Signed)
History and Physical Interval Note:  02/28/2013 7:28 AM  [name] [name]  has presented today for surgery, with the diagnosis of menometorrhagia  The various methods of treatment have been discussed with the patient and family. After consideration of risks, benefits and other options for treatment, the patient has consented to   Procedure(s): DILATATION & CURETTAGE/HYSTEROSCOPY WITH THERMACHOICE ABLATION (N/A) as a surgical intervention .  The patient's history has been reviewed, patient examined, no change in status, stable for surgery.  I have reviewed the patient's chart and labs.  Questions were answered to the patient's satisfaction.   She took her last Megace tablet yesterday  Tilda Burrow

## 2013-02-28 NOTE — Brief Op Note (Signed)
02/28/2013  8:34 AM  PATIENT:  [name] [name]  42 y.o. female  PRE-OPERATIVE DIAGNOSIS:  menometorrhagia  POST-OPERATIVE DIAGNOSIS:  menometorrhagia  PROCEDURE:  Procedure(s) with comments: DILATATION & CURETTAGE/HYSTEROSCOPY WITH THERMACHOICE ABLATION (N/A) - Total Ablation Therapy Time = 9 minutes 31 seconds  SURGEON:  Surgeon(s) and Role:    * Tilda Burrow, MD - Primary  PHYSICIAN ASSISTANT:   ASSISTANTS: none   ANESTHESIA:   local and general  EBL:     BLOOD ADMINISTERED:none  DRAINS: none   LOCAL MEDICATIONS USED:  MARCAINE    and Amount: 20 ml  SPECIMEN:  Source of Specimen:  uterine curettings  DISPOSITION OF SPECIMEN:  PATHOLOGY  COUNTS:  YES  TOURNIQUET:  * No tourniquets in log *  DICTATION: .Dragon Dictation  PLAN OF CARE: Discharge to home after PACU  PATIENT DISPOSITION:  PACU - hemodynamically stable.   Delay start of Pharmacological VTE agent (>24hrs) due to surgical blood loss or risk of bleeding: not applicable

## 2013-03-01 ENCOUNTER — Encounter (HOSPITAL_COMMUNITY): Payer: Self-pay | Admitting: Obstetrics and Gynecology

## 2013-03-10 ENCOUNTER — Ambulatory Visit (INDEPENDENT_AMBULATORY_CARE_PROVIDER_SITE_OTHER): Payer: Medicaid Other | Admitting: Obstetrics and Gynecology

## 2013-03-10 ENCOUNTER — Encounter: Payer: Self-pay | Admitting: Obstetrics and Gynecology

## 2013-03-10 VITALS — BP 90/60 | Ht 62.0 in | Wt 189.2 lb

## 2013-03-15 NOTE — Progress Notes (Signed)
Cancelled appt

## 2013-03-21 ENCOUNTER — Encounter: Payer: Medicaid Other | Admitting: Obstetrics and Gynecology

## 2014-06-11 ENCOUNTER — Encounter: Payer: Self-pay | Admitting: Obstetrics and Gynecology

## 2016-04-26 LAB — HEMOGLOBIN A1C: Hemoglobin A1C: 7.6

## 2016-05-20 ENCOUNTER — Other Ambulatory Visit: Payer: Self-pay | Admitting: Family Medicine

## 2016-05-20 DIAGNOSIS — Z1231 Encounter for screening mammogram for malignant neoplasm of breast: Secondary | ICD-10-CM

## 2016-06-11 ENCOUNTER — Ambulatory Visit: Payer: Medicaid Other

## 2016-08-07 LAB — LIPID PANEL
CHOLESTEROL: 194 (ref 0–200)
HDL: 34 — AB (ref 35–70)
LDL Cholesterol: 127
TRIGLYCERIDES: 167 — AB (ref 40–160)

## 2016-08-07 LAB — HEMOGLOBIN A1C: HEMOGLOBIN A1C: 6

## 2016-09-09 ENCOUNTER — Encounter (HOSPITAL_COMMUNITY): Payer: Medicaid Other

## 2016-09-09 ENCOUNTER — Encounter (HOSPITAL_COMMUNITY): Payer: Medicaid Other | Attending: Oncology | Admitting: Oncology

## 2016-09-09 ENCOUNTER — Encounter (HOSPITAL_COMMUNITY): Payer: Self-pay

## 2016-09-09 DIAGNOSIS — D72820 Lymphocytosis (symptomatic): Secondary | ICD-10-CM | POA: Insufficient documentation

## 2016-09-09 DIAGNOSIS — D72829 Elevated white blood cell count, unspecified: Secondary | ICD-10-CM

## 2016-09-09 NOTE — Progress Notes (Signed)
[facility]  CONSULT NOTE  Patient Care Team: Lanelle Bal, PA-C as PCP - General (Family Medicine)  CHIEF COMPLAINTS/PURPOSE OF CONSULTATION:  Chronic WBC elevation   HISTORY OF PRESENTING ILLNESS:  [name] [name] 46 y.o. female is here because of chronic WBC elevation.  Her most recent labs on August 12, 2016, her WBC was 18.1k, Hgb 13.2, Hematocrit 39.4%, platelets 319k. Differential demonstrated absolute neutrophils 8.8k, absolute lymphocytes 7.8k, absolute monocytes 1.1k. Previous CBC on 04/29/2016 showed WBC 13.8k, absolute lymphocytes 6.1k.  She doesn't have any chronic infections or allergies, but she does get recurrent boils under her right breast. She finished her most recent antibiotics a few days ago. She says these boils come and go. She is fatigued all of the time. She has some SOB, but she is a chronic smoker. She has a "rash" on her lower back that shows up every year around winter, and her PCP believes it may be shingles. Otherwise she denies any dental caries, UTIs, allergies/sinus problems or sources of other chronic infections. Denies chest pain or any other concerns.   MEDICAL HISTORY:  Past Medical History:  Diagnosis Date   Anemia    Anxiety    Asthma    COPD (chronic obstructive pulmonary disease)    Depression    Fatty liver    GERD (gastroesophageal reflux disease)    Hx of gonorrhea    Shortness of breath     SURGICAL HISTORY: Past Surgical History:  Procedure Laterality Date   cesarean section x 3     CHOLECYSTECTOMY     DILITATION & CURRETTAGE/HYSTROSCOPY WITH THERMACHOICE ABLATION N/A 02/28/2013   Procedure: DILATATION & CURETTAGE/HYSTEROSCOPY WITH THERMACHOICE ABLATION;  Surgeon: Jonnie Kind, MD;  Location: [facility];  Service: Gynecology;  Laterality: N/A;  Total Ablation Therapy Time = 9 minutes 31 seconds   EUS  04/06/2012   Procedure: ESOPHAGEAL ENDOSCOPIC ULTRASOUND (EUS) RADIAL;  Surgeon: Arta Silence, MD;   Location: [facility];  Service: Endoscopy;  Laterality: N/A;   TONSILLECTOMY     TUBAL LIGATION      SOCIAL HISTORY: Social History   Social History   Marital status: Single    Spouse name: N/A   Number of children: N/A   Years of education: N/A   Occupational History   Not on file.   Social History Main Topics   Smoking status: Current Every Day Smoker    Packs/day: 2.00    Years: 35.00    Types: Cigarettes   Smokeless tobacco: Never Used     Comment: never done snuff or chewing tobacco.   Alcohol use No   Drug use: No   Sexual activity: Not Currently   Other Topics Concern   Not on file   Social History Narrative   No narrative on file   Smoker - 1 ppd.   FAMILY HISTORY: Family History  Problem Relation Age of Onset   Cancer Father     lung   Spina bifida Brother    Father had lung cancer. No other family cancers.   ALLERGIES:  is allergic to codeine and sulfa antibiotics.  MEDICATIONS:  Current Outpatient Prescriptions  Medication Sig Dispense Refill   ARIPiprazole (ABILIFY) 5 MG tablet Take 5 mg by mouth daily.     esomeprazole (NEXIUM) 40 MG capsule Take 40 mg by mouth daily before breakfast.     flunisolide (AEROBID) 250 MCG/ACT inhaler Inhale 2 puffs into the lungs 2 (two) times daily.  furosemide (LASIX) 20 MG tablet Take 20 mg by mouth 2 (two) times daily.     ketorolac (TORADOL) 10 MG tablet Take 1 tablet (10 mg total) by mouth every 6 (six) hours as needed for pain. 20 tablet 0   traMADol (ULTRAM) 50 MG tablet Take 50 mg by mouth every 6 (six) hours as needed for pain.      traMADol (ULTRAM) 50 MG tablet Take 1 tablet (50 mg total) by mouth every 6 (six) hours as needed for pain. 30 tablet 1   Vilazodone HCl (VIIBRYD) 40 MG TABS Take 40 mg by mouth daily.     No current facility-administered medications for this visit.     Review of Systems  Constitutional: Positive for malaise/fatigue.  HENT: Negative.   Eyes:  Negative.   Respiratory: Positive for shortness of breath (smoker).   Cardiovascular: Negative.  Negative for chest pain.  Gastrointestinal: Negative.   Genitourinary: Negative.   Musculoskeletal: Negative.   Skin: Positive for rash (on lower back).       Boils under right breast  Neurological: Negative.   Endo/Heme/Allergies: Negative.   Psychiatric/Behavioral: Negative.   All other systems reviewed and are negative.  14 point ROS was done and is otherwise as detailed above or in HPI   PHYSICAL EXAMINATION: ECOG PERFORMANCE STATUS: 0 - Asymptomatic  Vitals:   09/09/16 1544  BP: (!) 151/81  Pulse: (!) 101  Resp: 18  Temp: 98.3 F (36.8 C)    Physical Exam  Constitutional: She is oriented to person, place, and time and well-developed, well-nourished, and in no distress.  HENT:  Head: Normocephalic and atraumatic.  Eyes: EOM are normal. Pupils are equal, round, and reactive to light.  Neck: Normal range of motion. Neck supple.  Cardiovascular: Normal rate, regular rhythm and normal heart sounds.   Pulmonary/Chest: Effort normal and breath sounds normal.  Abdominal: Soft. Bowel sounds are normal.  Musculoskeletal: Normal range of motion.  Neurological: She is alert and oriented to person, place, and time. Gait normal.  Skin: Skin is warm and dry.  No skin lesions noted under right breast. One healed lesion on her lower back, no evidence of vesicles or lesions in a dermatomal pattern.   Nursing note and vitals reviewed.  LABORATORY DATA:  I have reviewed the data as listed Lab Results  Component Value Date   WBC 15.6 (H) 02/21/2013   HGB 13.6 02/21/2013   HCT 41.4 02/21/2013   MCV 84.8 02/21/2013   PLT 412 (H) 02/21/2013   CMP     Component Value Date/Time   NA 137 02/21/2013 1130   K 4.0 02/21/2013 1130   CL 102 02/21/2013 1130   CO2 25 02/21/2013 1130   GLUCOSE 123 (H) 02/21/2013 1130   BUN 3 (L) 02/21/2013 1130   CREATININE 0.62 02/21/2013 1130   CALCIUM  9.5 02/21/2013 1130   GFRNONAA >90 02/21/2013 1130   GFRAA >90 02/21/2013 1130   Outside Labs 08/07/2016  Glucose, Serum  109 (H) BUN    4 (L) Creatinine, Serum  0.67 eGFR Non Afr Am  106 eGFR Afr Am   122 BUN/Creatinine Ratio 6 (L) Sodium, Serum   142 Potassium, Serum  3.9 Chloride, Serum  102 CO2, Total   25 Calcium, Serum  8.8 Protein, Total, Serum  6.8 Albumin, Serum  3.7 Globulin, Total   3.1 A/G ratio   1.2 Bilirubin, Total   <0.2 Alkaline Phosphatase, S 135 (H) AST (SGOT)   30 ALT (SGPT)  29  Cholesterol, Total  194 Triglycerides   167 (H) HDL Cholesterol  34 (L) VLDL Cholesterol Cal  33 LDL Cholesterol Calc 127 (H)  Hgb A1C   6.0 (H)  TSH    1.450  WBC    18.1 (H)  RBC    4.74 Hemoglobin    13.2 Hematocrit    39.4 MCV    83 MCH    27.8 MCHC    33.5 RDW    16.4 (H) Platelets    319 Neutrophils   49 Lymphocytes    43 Monocytes   6 Eosinophils   2 Basophils   0 Neutrophils (Absolute) 8.8 (H) Lymphocytes (Absolute) 7.8 (H) Monocytes (Absolute) 1.1 (H) Eosinophils (Absolute) 0.3 Basophils (Absolute)  0.1 Immature Granulocytes 0 Immature Grans (Abs) 0.0   Outside Labs from 04/29/2016  WBC    13.8 (H) Lymphocytes   6.1 (H)  AST (SGOT)   76 (H) ALT (SGPT)    46 (H)    ASSESSMENT & PLAN:  Chronic WBC elevation  I have ordered blood work today to rule out chronic leukemia/myeloproliferative disorders.   She will return for a follow up in 3 weeks to review the lab work.   ORDERS PLACED FOR THIS ENCOUNTER: Orders Placed This Encounter  Procedures   CBC with Differential   Comprehensive metabolic panel   JAK2 K240X, w Reflex to CALR/E12/MPL  Flow cytometry of peripheral blood  All questions were answered. The patient knows to call the clinic with any problems, questions or concerns.  I spent 20 minutes counseling the patient face to face. The total time spent in the appointment was 30 minutes and more than 50% was on counseling and  review of test results  This document serves as a record of services personally performed by Twana First, MD. It was created on her behalf by Martinique Casey, a trained medical scribe. The creation of this record is based on the scribe's personal observations and the provider's statements to them. This document has been checked and approved by the attending provider.  I have reviewed the above documentation for accuracy and completeness and I agree with the above.  This note was electronically signed.    Martinique M Casey  09/09/2016 3:42 PM

## 2016-09-09 NOTE — Patient Instructions (Signed)
[facility] Discharge Instructions  RECOMMENDATIONS MADE BY THE CONSULTANT AND ANY TEST RESULTS WILL BE SENT TO YOUR REFERRING PHYSICIAN.  You were seen today by Dr. Felipe DroneLouis Zhou You will have lab work today and we will call you with the results. Follow up in 3 weeks   Thank you for choosing [facility] at [facility] to provide your oncology and hematology care.  To afford each patient quality time with our provider, please arrive at least 15 minutes before your scheduled appointment time.    If you have a lab appointment with the Cancer Center please come in thru the  Main Entrance and check in at the main information desk  You need to re-schedule your appointment should you arrive 10 or more minutes late.  We strive to give you quality time with our providers, and arriving late affects you and other patients whose appointments are after yours.  Also, if you no show three or more times for appointments you may be dismissed from the clinic at the providers discretion.     Again, thank you for choosing [facility].  Our hope is that these requests will decrease the amount of time that you wait before being seen by our physicians.       _____________________________________________________________  Should you have questions after your visit to [facility], please contact our office at [phone] between the hours of 8:30 a.m. and 4:30 p.m.  Voicemails left after 4:30 p.m. will not be returned until the following business day.  For prescription refill requests, have your pharmacy contact our office.       Resources For Cancer Patients and their Caregivers ? American Cancer Society: Can assist with transportation, wigs, general needs, runs Look Good Feel Better.        219 113 [phone] ? Cancer Care: Provides financial assistance, online support groups, medication/co-pay assistance.  1-800-813-HOPE  (717)234-4135(4673) ? [facility] Assists [place] cancer patients and their families through emotional , educational and financial support.  ([phone] ? [place] DSS Where to apply for food stamps, Medicaid and utility assistance. 262-135-6850938-378-7765 ? [facility]: Transportation to medical appointments. (321)487-3002310-227-0475 ? Social Security Administration: May apply for disability if have a Stage IV cancer. [phone] [phone]) [phone] ? [place] [facility] [employer] and [employer] [facility]: Assists with nutrition, care and transit needs. [phone]  Cancer Center Support Programs: @10RELATIVEDAYS @ > Cancer Support Group  2nd Tuesday of the month 1pm-2pm, Journey Room  > Creative Journey  3rd Tuesday of the month 1130am-1pm, Journey Room  > Look Good Feel Better  1st Wednesday of the month 10am-12 noon, Journey Room (Call American Cancer Society to register [phone])

## 2016-09-14 LAB — BCR-ABL1, CML/ALL, PCR, QUANT

## 2016-09-15 ENCOUNTER — Encounter: Payer: Self-pay | Admitting: *Deleted

## 2016-09-15 NOTE — Progress Notes (Signed)
[facility] Psychosocial Distress Screening Clinical Social Work  Clinical Social Work was referred by distress screening protocol.  The patient scored a 5 on the Psychosocial Distress Thermometer which indicates mild distress. Clinical Social Worker reviewed chart to assess for distress and other psychosocial needs. Pt screened in error, not oncology pt at this time.   ONCBCN DISTRESS SCREENING 09/09/2016  Screening Type Initial Screening  Distress experienced in past week (1-10) 5  Family Problem type Children  Emotional problem type Depression;Nervousness/Anxiety  Physician notified of physical symptoms No;Yes  Referral to clinical psychology No  Referral to clinical social work No  Referral to dietition No  Referral to financial advocate No  Referral to support programs No  Referral to palliative care No    Clinical Social Worker follow up needed: No.  If yes, follow up plan:  [name] SalvageGrier Jezelle [name], [name] OraLCSW [facility] Tuesdays   [phone]

## 2016-09-21 LAB — JAK2 V617F, W REFLEX TO CALR/E12/MPL

## 2016-09-21 LAB — CALR + JAK2 E12-15 + MPL (REFLEXED)

## 2016-09-24 ENCOUNTER — Telehealth (HOSPITAL_COMMUNITY): Payer: Self-pay | Admitting: *Deleted

## 2016-09-24 NOTE — Telephone Encounter (Signed)
Notified patient that labs were negative per PA. And they will go over labs more in depth at follow up appt. She verbalized understanding.

## 2016-10-01 ENCOUNTER — Encounter (HOSPITAL_COMMUNITY): Payer: Medicaid Other | Attending: Oncology | Admitting: Oncology

## 2016-10-01 ENCOUNTER — Encounter (HOSPITAL_COMMUNITY): Payer: Self-pay

## 2016-10-01 VITALS — BP 128/73 | HR 91 | Temp 98.8°F | Resp 18 | Wt 191.6 lb

## 2016-10-01 DIAGNOSIS — D72820 Lymphocytosis (symptomatic): Secondary | ICD-10-CM | POA: Insufficient documentation

## 2016-10-01 DIAGNOSIS — Z720 Tobacco use: Secondary | ICD-10-CM | POA: Diagnosis not present

## 2016-10-01 DIAGNOSIS — D72829 Elevated white blood cell count, unspecified: Secondary | ICD-10-CM | POA: Diagnosis present

## 2016-10-01 NOTE — Progress Notes (Signed)
[place]  PROGRESS NOTE  Patient Care Team: Lanelle Bal, PA-C as PCP - General (Family Medicine)  CHIEF COMPLAINTS/PURPOSE OF CONSULTATION:  Chronic WBC elevation   HISTORY OF PRESENTING ILLNESS:  [name] [name] 46 y.o. female is here because of chronic WBC elevation.  Her most recent labs on August 12, 2016, her WBC was 18.1k, Hgb 13.2, Hematocrit 39.4%, platelets 319k. Differential demonstrated absolute neutrophils 8.8k, absolute lymphocytes 7.8k, absolute monocytes 1.1k. Previous CBC on 04/29/2016 showed WBC 13.8k, absolute lymphocytes 6.1k.  [name] [name] presents to the clinic today accompanied by boyfriend for continued follow up and to review her labs.  Leukocytosis workup 09/09/16: Chronic flow cytometry: negative, JAK2 V617 with reflex to CALR+JAK2 E12-15 + MPL was negative. BCR-ABL was negative.  She states she smokes 2 ppd. No new complaints today since her last visit.     MEDICAL HISTORY:  Past Medical History:  Diagnosis Date  . Anemia   . Anxiety   . Asthma   . COPD (chronic obstructive pulmonary disease) ([place])   . Depression   . Fatty liver   . GERD (gastroesophageal reflux disease)   . Hx of gonorrhea   . Shortness of breath     SURGICAL HISTORY: Past Surgical History:  Procedure Laterality Date  . cesarean section x 3    . CHOLECYSTECTOMY    . DILITATION & CURRETTAGE/HYSTROSCOPY WITH THERMACHOICE ABLATION N/A 02/28/2013   Procedure: DILATATION & CURETTAGE/HYSTEROSCOPY WITH THERMACHOICE ABLATION;  Surgeon: Jonnie Kind, MD;  Location: [facility];  Service: Gynecology;  Laterality: N/A;  Total Ablation Therapy Time = 9 minutes 31 seconds  . EUS  04/06/2012   Procedure: ESOPHAGEAL ENDOSCOPIC ULTRASOUND (EUS) RADIAL;  Surgeon: Arta Silence, MD;  Location: [facility];  Service: Endoscopy;  Laterality: N/A;  . TONSILLECTOMY    . TUBAL LIGATION      SOCIAL HISTORY: Social History   Social History  . Marital status: Single    Spouse  name: N/A  . Number of children: N/A  . Years of education: N/A   Occupational History  . Not on file.   Social History Main Topics  . Smoking status: Current Every Day Smoker    Packs/day: 2.00    Years: 35.00    Types: Cigarettes  . Smokeless tobacco: Never Used     Comment: never done snuff or chewing tobacco.  . Alcohol use No  . Drug use: No  . Sexual activity: Not Currently   Other Topics Concern  . Not on file   Social History Narrative  . No narrative on file   Smoker - 1 ppd.   FAMILY HISTORY: Family History  Problem Relation Age of Onset  . Cancer Father     lung  . Spina bifida Brother    Father had lung cancer. No other family cancers.   ALLERGIES:  is allergic to codeine and sulfa antibiotics.  MEDICATIONS:  Current Outpatient Prescriptions  Medication Sig Dispense Refill  . busPIRone (BUSPAR) 15 MG tablet Take 15 mg by mouth 3 (three) times daily.    Marland Kitchen esomeprazole (NEXIUM) 40 MG capsule Take 40 mg by mouth daily before breakfast.    . flunisolide (AEROBID) 250 MCG/ACT inhaler Inhale 2 puffs into the lungs 2 (two) times daily.    Marland Kitchen glipiZIDE (GLUCOTROL) 5 MG tablet Take 5 mg by mouth daily before breakfast.    . Vilazodone HCl (VIIBRYD) 40 MG TABS Take 40 mg by mouth daily.     No  current facility-administered medications for this visit.     Review of Systems  Constitutional: Negative.  Negative for chills and fever.  HENT: Negative.  Negative for hearing loss, sore throat and tinnitus.   Eyes: Negative.  Negative for blurred vision, photophobia and discharge.  Respiratory: Negative.  Negative for cough, hemoptysis, shortness of breath and wheezing.   Cardiovascular: Negative.  Negative for chest pain, palpitations, orthopnea, claudication and leg swelling.  Gastrointestinal: Negative.  Negative for abdominal pain, constipation, diarrhea, melena, nausea and vomiting.  Genitourinary: Negative.  Negative for dysuria and hematuria.  Musculoskeletal:  Negative.  Negative for back pain, joint pain and myalgias.  Skin: Negative.  Negative for itching and rash.  Neurological: Negative.  Negative for dizziness, weakness and headaches.  Endo/Heme/Allergies: Negative.  Negative for environmental allergies and polydipsia. Does not bruise/bleed easily.  Psychiatric/Behavioral: Negative.  Negative for depression. The patient is not nervous/anxious and does not have insomnia.   All other systems reviewed and are negative.  14 point ROS was done and is otherwise as detailed above or in HPI   PHYSICAL EXAMINATION: ECOG PERFORMANCE STATUS: 0 - Asymptomatic   Vitals:   10/01/16 1623  BP: 128/73  Pulse: 91  Resp: 18  Temp: 98.8 F (37.1 C)     Physical Exam  Constitutional: She is oriented to person, place, and time and well-developed, well-nourished, and in no distress. No distress.  HENT:  Head: Normocephalic and atraumatic.  Mouth/Throat: No oropharyngeal exudate.  Eyes: Conjunctivae and EOM are normal. Pupils are equal, round, and reactive to light. No scleral icterus.  Neck: Normal range of motion. Neck supple. No JVD present.  Cardiovascular: Normal rate, regular rhythm and normal heart sounds.  Exam reveals no gallop and no friction rub.   No murmur heard. Pulmonary/Chest: Effort normal and breath sounds normal. No respiratory distress. She has no wheezes. She has no rales.  Abdominal: Soft. Bowel sounds are normal. She exhibits no distension. There is no tenderness. There is no guarding.  Musculoskeletal: Normal range of motion. She exhibits no edema or tenderness.  Lymphadenopathy:    She has no cervical adenopathy.  Neurological: She is alert and oriented to person, place, and time. No cranial nerve deficit. Gait normal.  Skin: Skin is warm and dry. No rash noted. No erythema. No pallor.  Psychiatric: Affect and judgment normal.  Nursing note and vitals reviewed.  LABORATORY DATA:  I have reviewed the data as listed Lab  Results  Component Value Date   WBC 15.6 (H) 02/21/2013   HGB 13.6 02/21/2013   HCT 41.4 02/21/2013   MCV 84.8 02/21/2013   PLT 412 (H) 02/21/2013   CMP     Component Value Date/Time   NA 137 02/21/2013 1130   K 4.0 02/21/2013 1130   CL 102 02/21/2013 1130   CO2 25 02/21/2013 1130   GLUCOSE 123 (H) 02/21/2013 1130   BUN 3 (L) 02/21/2013 1130   CREATININE 0.62 02/21/2013 1130   CALCIUM 9.5 02/21/2013 1130   GFRNONAA >90 02/21/2013 1130   GFRAA >90 02/21/2013 1130   Outside Labs 08/07/2016  Glucose, Serum  109 (H) BUN    4 (L) Creatinine, Serum  0.67 eGFR Non Afr Am  106 eGFR Afr Am   122 BUN/Creatinine Ratio 6 (L) Sodium, Serum   142 Potassium, Serum  3.9 Chloride, Serum  102 CO2, Total   25 Calcium, Serum  8.8 Protein, Total, Serum  6.8 Albumin, Serum  3.7 Globulin, Total   3.1 A/G  ratio   1.2 Bilirubin, Total   <0.2 Alkaline Phosphatase, S 135 (H) AST (SGOT)   30 ALT (SGPT)   29  Cholesterol, Total  194 Triglycerides   167 (H) HDL Cholesterol  34 (L) VLDL Cholesterol Cal  33 LDL Cholesterol Calc 127 (H)  Hgb A1C   6.0 (H)  TSH    1.450  WBC    18.1 (H)  RBC    4.74 Hemoglobin    13.2 Hematocrit    39.4 MCV    83 MCH    27.8 MCHC    33.5 RDW    16.4 (H) Platelets    319 Neutrophils   49 Lymphocytes    43 Monocytes   6 Eosinophils   2 Basophils   0 Neutrophils (Absolute) 8.8 (H) Lymphocytes (Absolute) 7.8 (H) Monocytes (Absolute) 1.1 (H) Eosinophils (Absolute) 0.3 Basophils (Absolute)  0.1 Immature Granulocytes 0 Immature Grans (Abs) 0.0   Outside Labs from 04/29/2016  WBC    13.8 (H) Lymphocytes   6.1 (H)  AST (SGOT)   76 (H) ALT (SGPT)    46 (H)     PATHOLOGY:    ASSESSMENT & PLAN:  Chronic WBC elevation- likely due to chronic smoking.  Reviewed leukocytosis workup in detail with the patient. No evidence for leukemia or myeloproliferative disorder.  Counseled on smoke cessation and cutting down on smoking. Patient verbalized  understanding.  I have told her that I believe that her leukocytosis is due to her chronic smoking. RTC in 6 months with repeat CBC.       All questions were answered. The patient knows to call the clinic with any problems, questions or concerns.   This document serves as a record of services personally performed by Twana First, MD. It was created on her behalf by Shirlean Mylar, a trained medical scribe. The creation of this record is based on the scribe's personal observations and the provider's statements to them. This document has been checked and approved by the attending provider.  I have reviewed the above documentation for accuracy and completeness and I agree with the above.  This note was electronically signed.    [employer]  10/01/2016 11:09 AM

## 2016-10-01 NOTE — Patient Instructions (Signed)
[facility] at [facility] Discharge Instructions  RECOMMENDATIONS MADE BY THE CONSULTANT AND ANY TEST RESULTS WILL BE SENT TO YOUR REFERRING PHYSICIAN.  You were seen today by Dr. Louise Zhou Follow up in 6 months with labs See Amy up front for appointments   Thank you for choosing  Cancer Center at [facility] to provide your oncology and hematology care.  To afford each patient quality time with our provider, please arrive at least 15 minutes before your scheduled appointment time.    If you have a lab appointment with the Cancer Center please come in thru the  Main Entrance and check in at the main information desk  You need to re-schedule your appointment should you arrive 10 or more minutes late.  We strive to give you quality time with our providers, and arriving late affects you and other patients whose appointments are after yours.  Also, if you no show three or more times for appointments you may be dismissed from the clinic at the providers discretion.     Again, thank you for choosing [facility].  Our hope is that these requests will decrease the amount of time that you wait before being seen by our physicians.       _____________________________________________________________  Should you have questions after your visit to [facility], please contact our office at [phone] between the hours of 8:30 a.m. and 4:30 p.m.  Voicemails left after 4:30 p.m. will not be returned until the following business day.  For prescription refill requests, have your pharmacy contact our office.       Resources For Cancer Patients and their Caregivers ? American Cancer Society: Can assist with transportation, wigs, general needs, runs Look Good Feel Better.        1-888-227-6333 ? Cancer Care: Provides financial assistance, online support groups, medication/co-pay assistance.  1-800-813-HOPE (4673) ? [facility] Assists [place] cancer patients and their families through emotional , educational and financial support.  336-427-4357 ? [place] DSS Where to apply for food stamps, Medicaid and utility assistance. 336-342-1394 ? [facility]: Transportation to medical appointments. 336-347-2287 ? Social Security Administration: May apply for disability if have a Stage IV cancer. 336-342-7796 1-800-772-1213 ? [place] Aging, Disability and Transit Services: Assists with nutrition, care and transit needs. 336-349-2343  Cancer Center Support Programs: @10RELATIVEDAYS@ > Cancer Support Group  2nd Tuesday of the month 1pm-2pm, Journey Room  > Creative Journey  3rd Tuesday of the month 1130am-1pm, Journey Room  > Look Good Feel Better  1st Wednesday of the month 10am-12 noon, Journey Room (Call American Cancer Society to register 1-800-395-5775)    

## 2017-02-18 ENCOUNTER — Emergency Department (HOSPITAL_COMMUNITY)
Admission: EM | Admit: 2017-02-18 | Discharge: 2017-02-18 | Disposition: A | Discharge status: Home / Self Care | Payer: Medicaid Other | Attending: Emergency Medicine | Admitting: Emergency Medicine

## 2017-02-18 ENCOUNTER — Emergency Department (HOSPITAL_COMMUNITY): Payer: Medicaid Other

## 2017-02-18 ENCOUNTER — Encounter (HOSPITAL_COMMUNITY): Payer: Self-pay | Admitting: Cardiology

## 2017-02-18 DIAGNOSIS — Y929 Unspecified place or not applicable: Secondary | ICD-10-CM | POA: Diagnosis not present

## 2017-02-18 DIAGNOSIS — J449 Chronic obstructive pulmonary disease, unspecified: Secondary | ICD-10-CM | POA: Insufficient documentation

## 2017-02-18 DIAGNOSIS — Z79899 Other long term (current) drug therapy: Secondary | ICD-10-CM | POA: Diagnosis not present

## 2017-02-18 DIAGNOSIS — X58XXXA Exposure to other specified factors, initial encounter: Secondary | ICD-10-CM | POA: Insufficient documentation

## 2017-02-18 DIAGNOSIS — F17210 Nicotine dependence, cigarettes, uncomplicated: Secondary | ICD-10-CM | POA: Insufficient documentation

## 2017-02-18 DIAGNOSIS — J45909 Unspecified asthma, uncomplicated: Secondary | ICD-10-CM | POA: Insufficient documentation

## 2017-02-18 DIAGNOSIS — Y939 Activity, unspecified: Secondary | ICD-10-CM | POA: Insufficient documentation

## 2017-02-18 DIAGNOSIS — S299XXA Unspecified injury of thorax, initial encounter: Secondary | ICD-10-CM | POA: Diagnosis present

## 2017-02-18 DIAGNOSIS — Z7984 Long term (current) use of oral hypoglycemic drugs: Secondary | ICD-10-CM | POA: Insufficient documentation

## 2017-02-18 DIAGNOSIS — Y999 Unspecified external cause status: Secondary | ICD-10-CM | POA: Insufficient documentation

## 2017-02-18 DIAGNOSIS — S20211A Contusion of right front wall of thorax, initial encounter: Secondary | ICD-10-CM | POA: Diagnosis not present

## 2017-02-18 MED ORDER — KETOROLAC TROMETHAMINE 30 MG/ML IJ SOLN
30.0000 mg | Freq: Once | INTRAMUSCULAR | Status: AC
  Administered 2017-02-18: 30 mg via INTRAMUSCULAR
  Filled 2017-02-18: qty 1

## 2017-02-18 MED ORDER — HYDROCODONE-ACETAMINOPHEN 5-325 MG PO TABS: 1.0000 | ORAL_TABLET | ORAL | 0 refills | Status: DC | PRN
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
## 2017-03-05 LAB — HEMOGLOBIN A1C: HEMOGLOBIN A1C: 10.2

## 2017-03-31 ENCOUNTER — Encounter (HOSPITAL_COMMUNITY): Payer: Medicaid Other | Attending: Oncology | Admitting: Oncology

## 2017-03-31 ENCOUNTER — Encounter (HOSPITAL_COMMUNITY): Payer: Self-pay | Admitting: Oncology

## 2017-03-31 ENCOUNTER — Encounter (HOSPITAL_COMMUNITY): Payer: Medicaid Other

## 2017-03-31 VITALS — BP 122/80 | HR 83 | Temp 97.9°F | Resp 16 | Ht 61.0 in | Wt 192.0 lb

## 2017-03-31 DIAGNOSIS — D72829 Elevated white blood cell count, unspecified: Secondary | ICD-10-CM

## 2017-03-31 DIAGNOSIS — D649 Anemia, unspecified: Secondary | ICD-10-CM | POA: Diagnosis not present

## 2017-03-31 DIAGNOSIS — F17210 Nicotine dependence, cigarettes, uncomplicated: Secondary | ICD-10-CM | POA: Diagnosis not present

## 2017-03-31 DIAGNOSIS — E119 Type 2 diabetes mellitus without complications: Secondary | ICD-10-CM | POA: Insufficient documentation

## 2017-03-31 DIAGNOSIS — D72820 Lymphocytosis (symptomatic): Secondary | ICD-10-CM

## 2017-03-31 DIAGNOSIS — F419 Anxiety disorder, unspecified: Secondary | ICD-10-CM | POA: Diagnosis not present

## 2017-03-31 DIAGNOSIS — Z7984 Long term (current) use of oral hypoglycemic drugs: Secondary | ICD-10-CM | POA: Insufficient documentation

## 2017-03-31 DIAGNOSIS — J449 Chronic obstructive pulmonary disease, unspecified: Secondary | ICD-10-CM | POA: Diagnosis not present

## 2017-03-31 DIAGNOSIS — K760 Fatty (change of) liver, not elsewhere classified: Secondary | ICD-10-CM | POA: Insufficient documentation

## 2017-03-31 DIAGNOSIS — K219 Gastro-esophageal reflux disease without esophagitis: Secondary | ICD-10-CM | POA: Insufficient documentation

## 2017-03-31 DIAGNOSIS — F17200 Nicotine dependence, unspecified, uncomplicated: Secondary | ICD-10-CM | POA: Diagnosis not present

## 2017-03-31 DIAGNOSIS — F329 Major depressive disorder, single episode, unspecified: Secondary | ICD-10-CM | POA: Diagnosis not present

## 2017-03-31 LAB — COMPREHENSIVE METABOLIC PANEL
ALT: 77 U/L — ABNORMAL HIGH (ref 14–54)
AST: 120 U/L — AB (ref 15–41)
Albumin: 3.6 g/dL (ref 3.5–5.0)
Alkaline Phosphatase: 140 U/L — ABNORMAL HIGH (ref 38–126)
Anion gap: 7 (ref 5–15)
BUN: 6 mg/dL (ref 6–20)
CHLORIDE: 99 mmol/L — AB (ref 101–111)
CO2: 28 mmol/L (ref 22–32)
Calcium: 9.2 mg/dL (ref 8.9–10.3)
Creatinine, Ser: 0.69 mg/dL (ref 0.44–1.00)
Glucose, Bld: 241 mg/dL — ABNORMAL HIGH (ref 65–99)
Potassium: 3.6 mmol/L (ref 3.5–5.1)
SODIUM: 134 mmol/L — AB (ref 135–145)
Total Bilirubin: 0.4 mg/dL (ref 0.3–1.2)
Total Protein: 7.4 g/dL (ref 6.5–8.1)

## 2017-03-31 LAB — CBC WITH DIFFERENTIAL/PLATELET
BASOS PCT: 0 %
Basophils Absolute: 0 10*3/uL (ref 0.0–0.1)
EOS PCT: 2 %
Eosinophils Absolute: 0.3 10*3/uL (ref 0.0–0.7)
HEMATOCRIT: 42.1 % (ref 36.0–46.0)
HEMOGLOBIN: 13.9 g/dL (ref 12.0–15.0)
Lymphocytes Relative: 44 %
Lymphs Abs: 6.2 10*3/uL — ABNORMAL HIGH (ref 0.7–4.0)
MCH: 28.2 pg (ref 26.0–34.0)
MCHC: 33 g/dL (ref 30.0–36.0)
MCV: 85.4 fL (ref 78.0–100.0)
Monocytes Absolute: 0.7 10*3/uL (ref 0.1–1.0)
Monocytes Relative: 5 %
NEUTROS PCT: 49 %
Neutro Abs: 6.8 10*3/uL (ref 1.7–7.7)
Platelets: 208 10*3/uL (ref 150–400)
RBC: 4.93 MIL/uL (ref 3.87–5.11)
RDW: 15 % (ref 11.5–15.5)
WBC: 14 10*3/uL — AB (ref 4.0–10.5)

## 2017-03-31 NOTE — Progress Notes (Signed)
[facility]  PROGRESS NOTE  Patient Care Team: Lanelle Bal, PA-C as PCP - General (Family Medicine)  CHIEF COMPLAINTS/PURPOSE OF CONSULTATION:  Chronic WBC elevation   HISTORY OF PRESENTING ILLNESS:  [name] [name] 46 y.o. female is here because of chronic WBC elevation.  Her most recent labs on August 12, 2016, her WBC was 18.1k, Hgb 13.2, Hematocrit 39.4%, platelets 319k. Differential demonstrated absolute neutrophils 8.8k, absolute lymphocytes 7.8k, absolute monocytes 1.1k. Previous CBC on 04/29/2016 showed WBC 13.8k, absolute lymphocytes 6.1k.  [name] presents to the clinic today accompanied by boyfriend for continued follow up and to review her labs.  Leukocytosis workup 09/09/16: Chronic flow cytometry: negative, JAK2 V617 with reflex to CALR+JAK2 E12-15 + MPL was negative. BCR-ABL was negative.  She states she smokes 2 ppd. No new complaints today since her last visit.   INTERVAL HISTORY: Patient presented today for continued follow-up. She states that since her last visit she went to the ED on 02/18/17 for further pain and was found to have a rib contusion. She also states her PCP has change her diabetic medications since her diabetes was getting worse. Otherwise she denies any recent infections. She currently smokes 1 pack per day. She denies any chest pain, shortness breath, abdominal pain, nausea, vomiting, diarrhea. Her CBC today revealed that her WBC is 14 K.    MEDICAL HISTORY:  Past Medical History:  Diagnosis Date  . Anemia   . Anxiety   . Asthma   . COPD (chronic obstructive pulmonary disease) ([place])   . Depression   . Fatty liver   . GERD (gastroesophageal reflux disease)   . Hx of gonorrhea   . Shortness of breath     SURGICAL HISTORY: Past Surgical History:  Procedure Laterality Date  . cesarean section x 3    . CHOLECYSTECTOMY    . DILITATION & CURRETTAGE/HYSTROSCOPY WITH THERMACHOICE ABLATION N/A 02/28/2013   Procedure: DILATATION  & CURETTAGE/HYSTEROSCOPY WITH THERMACHOICE ABLATION;  Surgeon: Jonnie Kind, MD;  Location: [facility];  Service: Gynecology;  Laterality: N/A;  Total Ablation Therapy Time = 9 minutes 31 seconds  . EUS  04/06/2012   Procedure: ESOPHAGEAL ENDOSCOPIC ULTRASOUND (EUS) RADIAL;  Surgeon: Arta Silence, MD;  Location: [facility];  Service: Endoscopy;  Laterality: N/A;  . TONSILLECTOMY    . TUBAL LIGATION      SOCIAL HISTORY: Social History   Social History  . Marital status: Single    Spouse name: N/A  . Number of children: N/A  . Years of education: N/A   Occupational History  . Not on file.   Social History Main Topics  . Smoking status: Current Every Day Smoker    Packs/day: 2.00    Years: 35.00    Types: Cigarettes  . Smokeless tobacco: Never Used     Comment: never done snuff or chewing tobacco.  . Alcohol use No  . Drug use: No  . Sexual activity: Not Currently   Other Topics Concern  . Not on file   Social History Narrative  . No narrative on file   Smoker - 1 ppd.   FAMILY HISTORY: Family History  Problem Relation Age of Onset  . Cancer Father        lung  . Spina bifida Brother    Father had lung cancer. No other family cancers.   ALLERGIES:  is allergic to codeine and sulfa antibiotics.  MEDICATIONS:  Current Outpatient Prescriptions  Medication Sig Dispense Refill  .  ALPRAZolam (XANAX) 1 MG tablet Take 1 mg by mouth at bedtime as needed for anxiety or sleep.    . busPIRone (BUSPAR) 15 MG tablet Take 30 mg by mouth 3 (three) times daily.     Marland Kitchen esomeprazole (NEXIUM) 40 MG capsule Take 40 mg by mouth daily before breakfast.    . flunisolide (AEROBID) 250 MCG/ACT inhaler Inhale 2 puffs into the lungs 2 (two) times daily.    Marland Kitchen glipiZIDE (GLUCOTROL) 5 MG tablet Take 5 mg by mouth daily before breakfast.    . HYDROcodone-acetaminophen (NORCO/VICODIN) 5-325 MG tablet Take 1 tablet by mouth every 4 (four) hours as needed. 10 tablet 0  . ibuprofen (ADVIL,MOTRIN)  800 MG tablet Take 800 mg by mouth every 8 (eight) hours as needed for moderate pain.    . Naproxen Sodium (ALEVE PO) Take 1 tablet by mouth every 8 (eight) hours as needed (pain).     Marland Kitchen sitaGLIPtin (JANUVIA) 50 MG tablet Take 50 mg by mouth daily.    . Vilazodone HCl (VIIBRYD) 40 MG TABS Take 40 mg by mouth daily.     No current facility-administered medications for this visit.     Review of Systems  Constitutional: Negative.  Negative for chills and fever.  HENT: Negative.  Negative for hearing loss, sore throat and tinnitus.   Eyes: Negative.  Negative for blurred vision, photophobia and discharge.  Respiratory: Negative.  Negative for cough, hemoptysis, shortness of breath and wheezing.   Cardiovascular: Negative.  Negative for chest pain, palpitations, orthopnea, claudication and leg swelling.  Gastrointestinal: Negative.  Negative for abdominal pain, constipation, diarrhea, melena, nausea and vomiting.  Genitourinary: Negative.  Negative for dysuria and hematuria.  Musculoskeletal: Negative.  Negative for back pain, joint pain and myalgias.  Skin: Negative.  Negative for itching and rash.  Neurological: Negative.  Negative for dizziness, weakness and headaches.  Endo/Heme/Allergies: Negative.  Negative for environmental allergies and polydipsia. Does not bruise/bleed easily.  Psychiatric/Behavioral: Negative.  Negative for depression. The patient is not nervous/anxious and does not have insomnia.   All other systems reviewed and are negative.  14 point ROS was done and is otherwise as detailed above or in HPI   PHYSICAL EXAMINATION: ECOG PERFORMANCE STATUS: 0 - Asymptomatic   Vitals:   03/31/17 1458  BP: 122/80  Pulse: 83  Resp: 16  Temp: 97.9 F (36.6 C)  SpO2: 97%     Physical Exam  Constitutional: She is oriented to person, place, and time and well-developed, well-nourished, and in no distress. No distress.  HENT:  Head: Normocephalic and atraumatic.    Mouth/Throat: No oropharyngeal exudate.  Eyes: Pupils are equal, round, and reactive to light. Conjunctivae and EOM are normal. No scleral icterus.  Neck: Normal range of motion. Neck supple. No JVD present.  Cardiovascular: Normal rate, regular rhythm and normal heart sounds.  Exam reveals no gallop and no friction rub.   No murmur heard. Pulmonary/Chest: Effort normal and breath sounds normal. No respiratory distress. She has no wheezes. She has no rales.  Abdominal: Soft. Bowel sounds are normal. She exhibits no distension. There is no tenderness. There is no guarding.  Musculoskeletal: Normal range of motion. She exhibits no edema or tenderness.  Lymphadenopathy:    She has no cervical adenopathy.  Neurological: She is alert and oriented to person, place, and time. No cranial nerve deficit. Gait normal.  Skin: Skin is warm and dry. No rash noted. No erythema. No pallor.  Psychiatric: Affect and judgment normal.  Nursing note and vitals reviewed.  LABORATORY DATA:  I have reviewed the data as listed Lab Results  Component Value Date   WBC 14.0 (H) 03/31/2017   HGB 13.9 03/31/2017   HCT 42.1 03/31/2017   MCV 85.4 03/31/2017   PLT 208 03/31/2017   CMP     Component Value Date/Time   NA 137 02/21/2013 1130   K 4.0 02/21/2013 1130   CL 102 02/21/2013 1130   CO2 25 02/21/2013 1130   GLUCOSE 123 (H) 02/21/2013 1130   BUN 3 (L) 02/21/2013 1130   CREATININE 0.62 02/21/2013 1130   CALCIUM 9.5 02/21/2013 1130   GFRNONAA >90 02/21/2013 1130   GFRAA >90 02/21/2013 1130   Outside Labs 08/07/2016  Glucose, Serum  109 (H) BUN    4 (L) Creatinine, Serum  0.67 eGFR Non Afr Am  106 eGFR Afr Am   122 BUN/Creatinine Ratio 6 (L) Sodium, Serum   142 Potassium, Serum  3.9 Chloride, Serum  102 CO2, Total   25 Calcium, Serum  8.8 Protein, Total, Serum  6.8 Albumin, Serum  3.7 Globulin, Total   3.1 A/G ratio   1.2 Bilirubin, Total   <0.2 Alkaline Phosphatase, S 135 (H) AST  (SGOT)   30 ALT (SGPT)   29  Cholesterol, Total  194 Triglycerides   167 (H) HDL Cholesterol  34 (L) VLDL Cholesterol Cal  33 LDL Cholesterol Calc 127 (H)  Hgb A1C   6.0 (H)  TSH    1.450  WBC    18.1 (H)  RBC    4.74 Hemoglobin    13.2 Hematocrit    39.4 MCV    83 MCH    27.8 MCHC    33.5 RDW    16.4 (H) Platelets    319 Neutrophils   49 Lymphocytes    43 Monocytes   6 Eosinophils   2 Basophils   0 Neutrophils (Absolute) 8.8 (H) Lymphocytes (Absolute) 7.8 (H) Monocytes (Absolute) 1.1 (H) Eosinophils (Absolute) 0.3 Basophils (Absolute)  0.1 Immature Granulocytes 0 Immature Grans (Abs) 0.0   Outside Labs from 04/29/2016  WBC    13.8 (H) Lymphocytes   6.1 (H)  AST (SGOT)   76 (H) ALT (SGPT)    46 (H)     PATHOLOGY:    ASSESSMENT & PLAN:  Chronic WBC elevation- likely due to chronic smoking. BCR-ABL and JAK2 mutation negative. Peripheral flow cytometry negative.  PLAN: I have told her that I believe that her leukocytosis is due to her chronic smoking. I have reviewed her CBC with her in detail. Her leukocytosis has improved since she's cut down on her smoking. I have again encouraged her on continued first for smoke cessation. RTC in 6 months with repeat CBC.   Orders Placed This Encounter  Procedures  . CBC with Differential    Standing Status:   Future    Standing Expiration Date:   03/31/2018  . Comprehensive metabolic panel    Standing Status:   Future    Standing Expiration Date:   03/31/2018       All questions were answered. The patient knows to call the clinic with any problems, questions or concerns.  This note was electronically signed.    Twana First, MD  03/31/2017 3:04 PM

## 2017-04-19 ENCOUNTER — Ambulatory Visit: Payer: Medicaid Other | Admitting: "Endocrinology

## 2017-04-22 ENCOUNTER — Ambulatory Visit (INDEPENDENT_AMBULATORY_CARE_PROVIDER_SITE_OTHER): Payer: Medicaid Other | Admitting: "Endocrinology

## 2017-04-22 ENCOUNTER — Encounter: Payer: Self-pay | Admitting: "Endocrinology

## 2017-04-22 VITALS — BP 131/85 | HR 82 | Ht 61.0 in | Wt 186.0 lb

## 2017-04-22 DIAGNOSIS — E782 Mixed hyperlipidemia: Secondary | ICD-10-CM

## 2017-04-22 DIAGNOSIS — E1165 Type 2 diabetes mellitus with hyperglycemia: Secondary | ICD-10-CM | POA: Diagnosis not present

## 2017-04-22 DIAGNOSIS — IMO0002 Reserved for concepts with insufficient information to code with codable children: Secondary | ICD-10-CM | POA: Insufficient documentation

## 2017-04-22 DIAGNOSIS — E118 Type 2 diabetes mellitus with unspecified complications: Secondary | ICD-10-CM | POA: Diagnosis not present

## 2017-04-22 DIAGNOSIS — Z6835 Body mass index (BMI) 35.0-35.9, adult: Secondary | ICD-10-CM

## 2017-04-22 DIAGNOSIS — E6609 Other obesity due to excess calories: Secondary | ICD-10-CM | POA: Insufficient documentation

## 2017-04-22 DIAGNOSIS — Z6834 Body mass index (BMI) 34.0-34.9, adult: Secondary | ICD-10-CM

## 2017-04-22 MED ORDER — METFORMIN HCL 500 MG PO TABS: 500.0000 mg | ORAL_TABLET | Freq: Two times a day (BID) | ORAL | 2 refills | Status: DC

## 2017-04-22 NOTE — Patient Instructions (Signed)

## 2017-04-22 NOTE — Progress Notes (Signed)
Subjective:    Patient ID: [name] [name], female    DOB: [birth year].  she is being seen in consultation for management of currently uncontrolled symptomatic diabetes requested by  Lianne Moris, PA-C.   Past Medical History:  Diagnosis Date  . Anemia   . Anxiety   . Asthma   . COPD (chronic obstructive pulmonary disease) ([facility])   . Depression   . Fatty liver   . GERD (gastroesophageal reflux disease)   . Hx of gonorrhea   . Shortness of breath    Past Surgical History:  Procedure Laterality Date  . cesarean section x 3    . CHOLECYSTECTOMY    . DILITATION & CURRETTAGE/HYSTROSCOPY WITH THERMACHOICE ABLATION N/A 02/28/2013   Procedure: DILATATION & CURETTAGE/HYSTEROSCOPY WITH THERMACHOICE ABLATION;  Surgeon: Tilda Burrow, MD;  Location: [facility];  Service: Gynecology;  Laterality: N/A;  Total Ablation Therapy Time = 9 minutes 31 seconds  . EUS  04/06/2012   Procedure: ESOPHAGEAL ENDOSCOPIC ULTRASOUND (EUS) RADIAL;  Surgeon: Willis Modena, MD;  Location: [facility];  Service: Endoscopy;  Laterality: N/A;  . TONSILLECTOMY    . TUBAL LIGATION     Social History   Social History  . Marital status: Single    Spouse name: N/A  . Number of children: N/A  . Years of education: N/A   Social History Main Topics  . Smoking status: Current Every Day Smoker    Packs/day: 2.00    Years: 35.00    Types: Cigarettes  . Smokeless tobacco: Never Used     Comment: never done snuff or chewing tobacco.  . Alcohol use No  . Drug use: No  . Sexual activity: Not Currently   Other Topics Concern  . None   Social History Narrative  . None   Outpatient Encounter Prescriptions as of 04/22/2017  Medication Sig  . ALPRAZolam (XANAX) 1 MG tablet Take 1 mg by mouth at bedtime as needed for anxiety or sleep.  . busPIRone (BUSPAR) 15 MG tablet Take 30 mg by mouth 3 (three) times daily.   Marland Kitchen esomeprazole (NEXIUM) 40 MG capsule Take 40 mg by mouth daily before breakfast.  . sitaGLIPtin  (JANUVIA) 50 MG tablet Take 50 mg by mouth daily.  . Vilazodone HCl (VIIBRYD) 40 MG TABS Take 40 mg by mouth daily.  . [DISCONTINUED] glipiZIDE (GLUCOTROL) 5 MG tablet Take 5 mg by mouth daily before breakfast.  . flunisolide (AEROBID) 250 MCG/ACT inhaler Inhale 2 puffs into the lungs 2 (two) times daily.  Marland Kitchen HYDROcodone-acetaminophen (NORCO/VICODIN) 5-325 MG tablet Take 1 tablet by mouth every 4 (four) hours as needed.  Marland Kitchen ibuprofen (ADVIL,MOTRIN) 800 MG tablet Take 800 mg by mouth every 8 (eight) hours as needed for moderate pain.  . metFORMIN (GLUCOPHAGE) 500 MG tablet Take 1 tablet (500 mg total) by mouth 2 (two) times daily with a meal.  . Naproxen Sodium (ALEVE PO) Take 1 tablet by mouth every 8 (eight) hours as needed (pain).    No facility-administered encounter medications on file as of 04/22/2017.     ALLERGIES: Allergies  Allergen Reactions  . Codeine Nausea And Vomiting  . Sulfa Antibiotics Nausea And Vomiting    VACCINATION STATUS:  There is no immunization history on file for this patient.  Diabetes  She presents for her initial diabetic visit. She has type 2 diabetes mellitus. Onset time: She was diagnosed at approximate age of 44 years. Her disease course has been worsening (She reports A1c of 10.2%  from 2 months ago.). There are no hypoglycemic associated symptoms. Pertinent negatives for hypoglycemia include no confusion, headaches, pallor or seizures. Associated symptoms include blurred vision, fatigue, polydipsia and polyuria. Pertinent negatives for diabetes include no chest pain and no polyphagia. There are no hypoglycemic complications. Symptoms are worsening. There are no diabetic complications. Risk factors for coronary artery disease include dyslipidemia, diabetes mellitus, obesity, sedentary lifestyle and tobacco exposure. Current diabetic treatment includes oral agent (dual therapy). Her weight is decreasing steadily. She is following a generally unhealthy diet. When  asked about meal planning, she reported none. She has not had a previous visit with a dietitian. She never participates in exercise. (She did not bring any meter nor logs with her today. She reports that she has never used a meter, she was educated on how to use a meter today.  ) An ACE inhibitor/angiotensin II receptor blocker is not being taken. She sees a podiatrist.Eye exam is not current.  Hyperlipidemia  This is a chronic problem. The current episode started more than 1 year ago. The problem is uncontrolled. Recent lipid tests were reviewed and are high (Her LDL from December was 127). Exacerbating diseases include diabetes and obesity. Pertinent negatives include no chest pain, myalgias or shortness of breath. She is currently on no antihyperlipidemic treatment. Compliance problems include medication cost and psychosocial issues.  Risk factors for coronary artery disease include a sedentary lifestyle, obesity, dyslipidemia and diabetes mellitus.       Review of Systems  Constitutional: Positive for fatigue. Negative for chills, fever and unexpected weight change.  HENT: Negative for trouble swallowing and voice change.   Eyes: Positive for blurred vision. Negative for visual disturbance.  Respiratory: Negative for cough, shortness of breath and wheezing.   Cardiovascular: Negative for chest pain, palpitations and leg swelling.  Gastrointestinal: Negative for diarrhea, nausea and vomiting.  Endocrine: Positive for polydipsia and polyuria. Negative for cold intolerance, heat intolerance and polyphagia.  Musculoskeletal: Negative for arthralgias and myalgias.  Skin: Negative for color change, pallor, rash and wound.  Neurological: Negative for seizures and headaches.  Psychiatric/Behavioral: Negative for confusion and suicidal ideas.    Objective:    BP 131/85   Pulse 82   Ht  (1.549 m)   Wt 186 lb (84.4 kg)   BMI 35.14 kg/m   Wt Readings from Last 3 Encounters:  04/22/17 186  lb (84.4 kg)  03/31/17 192 lb (87.1 kg)  02/18/17 190 lb (86.2 kg)     Physical Exam  Constitutional: She is oriented to person, place, and time. She appears well-developed.  HENT:  Head: Normocephalic and atraumatic.  Eyes: EOM are normal.  Neck: Normal range of motion. Neck supple. No tracheal deviation present. No thyromegaly present.  Cardiovascular: Normal rate and regular rhythm.   Pulmonary/Chest: Effort normal and breath sounds normal.  Abdominal: Soft. Bowel sounds are normal. There is no tenderness. There is no guarding.  Musculoskeletal: Normal range of motion. She exhibits no edema.  Neurological: She is alert and oriented to person, place, and time. She has normal reflexes. No cranial nerve deficit. Coordination normal.  Skin: Skin is warm and dry. No rash noted. No erythema. No pallor.  Extensive tattoos.  Psychiatric: She has a normal mood and affect. Judgment normal.   CMP ( most recent) CMP     Component Value Date/Time   NA 134 (L) 03/31/2017 1423   K 3.6 03/31/2017 1423   CL 99 (L) 03/31/2017 1423   CO2 28 03/31/2017  1423   GLUCOSE 241 (H) 03/31/2017 1423   BUN 6 03/31/2017 1423   CREATININE 0.69 03/31/2017 1423   CALCIUM 9.2 03/31/2017 1423   PROT 7.4 03/31/2017 1423   ALBUMIN 3.6 03/31/2017 1423   AST 120 (H) 03/31/2017 1423   ALT 77 (H) 03/31/2017 1423   ALKPHOS 140 (H) 03/31/2017 1423   BILITOT 0.4 03/31/2017 1423   GFRNONAA >60 03/31/2017 1423   GFRAA >60 03/31/2017 1423    Diabetic Labs (most recent): Lab Results  Component Value Date   HGBA1C 6 08/07/2016   HGBA1C 7.6 04/26/2016     Lipid Panel ( most recent) Lipid Panel     Component Value Date/Time   CHOL 194 08/07/2016   TRIG 167 (A) 08/07/2016   HDL 34 (A) 08/07/2016   LDLCALC 127 08/07/2016      Assessment & Plan:   1. Uncontrolled type 2 diabetes mellitus with complication, without long-term current use of insulin ([facility])  - Patient has currently uncontrolled symptomatic  type 2 DM since  46 years of age,  with most recent reported A1c of 10 .2 %. Recent labs reviewed.  - She does not report gross complications from diabetes, however, Sylvester S Spilde remains at a high risk for more acute and chronic complications which include CAD, CVA, CKD, retinopathy, and neuropathy. These are all discussed in detail with the patient.  - I have counseled her on diet management and weight loss, by adopting a carbohydrate restricted/protein rich diet.  - Suggestion is made for her to avoid simple carbohydrates  from her diet including Cakes, Sweet Desserts, Ice Cream, Soda (diet and regular), Sweet Tea, Candies, Chips, Cookies, Store Bought Juices, Alcohol in Excess of  1-2 drinks a day, Artificial Sweeteners, and "Sugar-free" Products. This will help patient to have stable blood glucose profile and potentially avoid unintended weight gain.  - I encouraged her to switch to  unprocessed or minimally processed complex starch and increased protein intake (animal or plant source), fruits, and vegetables.  - she is advised to stick to a routine mealtimes to eat 3 meals  a day and avoid unnecessary snacks ( to snack only to correct hypoglycemia).   - she will be scheduled with Norm SaltPenny Crumpton, RDN, CDE for individualized DM education.  - I have approached her with the following individualized plan to manage diabetes and patient agrees:   -  She may require at least basal insulin to treat her diabetes to target, however it is essential to assure her commitment for proper monitoring for safe use of insulin. - I  will proceed to initiate strict monitoring of glucose 4 times a day-before meals and at bedtime and she is asked to return in one week with her logs and meter.  -Patient is encouraged to call clinic for blood glucose levels less than 70 or above 300 mg /dl. - I will continue Januvia 50 minute grams by mouth daily, therapeutically suitable for patient . - I have discussed and added  low-dose metformin, 500 mg by mouth twice a day. - I will discontinue glipizide, risk outweighs benefit for this patient.  - she will be considered for incretin therapy as appropriate next visit. - Patient specific target  A1c;  LDL, HDL, Triglycerides, and  Waist Circumference were discussed in detail.  2) BP/HTN: Controlled. She is not on an ACE inhibitor nor ARB, will be considered on subsequent visit.  3) Lipids/HPL:   Uncontrolled with LDL 127. patient is hesitant to go on  statin, she will be approached again with repeat fasting lipid panel.  4)  Weight/Diet: CDE Consult will be initiated , exercise, and detailed carbohydrates information provided.  5) goiter: New diagnosis for her. - I'll proceed to obtain a baseline thyroid/neck ultrasound.  6) Chronic Care/Health Maintenance:  -she  Is not  on ACEI/ARB and Statin medications,   is encouraged to continue to follow up with Ophthalmology, Dentist,  Podiatrist at least yearly or according to recommendations, and advised to Quit  Smoking. I have recommended yearly flu vaccine and pneumonia vaccination at least every 5 years; moderate intensity exercise for up to 150 minutes weekly; and  sleep for at least 7 hours a day.  - Time spent with the patient: 1 hour, of which >50% was spent in obtaining information about her symptoms, reviewing her previous labs, evaluations, and treatments, counseling her about her  currently uncontrolled symptomatic type 2 diabetes, hyperlipidemia, hypertension, and developing a plan for long term treatment.  - Patient to bring meter and  blood glucose logs during her next visit.  - I advised patient to maintain close follow up with Lianne Moris, PA-C for primary care needs.  Follow up plan: - Return in about 1 week (around 04/29/2017) for follow up with meter and logs- no labs, Thyroid / Neck Ultrasound.  Marquis Lunch, MD Phone: [phone]  Fax: [phone]   04/22/2017, 9:53 AM   This note was  partially dictated with voice recognition software. Similar sounding words can be transcribed inadequately or may not  be corrected upon review.

## 2017-04-26 ENCOUNTER — Other Ambulatory Visit: Payer: Self-pay | Admitting: "Endocrinology

## 2017-04-26 DIAGNOSIS — E1165 Type 2 diabetes mellitus with hyperglycemia: Secondary | ICD-10-CM

## 2017-04-26 DIAGNOSIS — IMO0002 Reserved for concepts with insufficient information to code with codable children: Secondary | ICD-10-CM

## 2017-04-26 DIAGNOSIS — E049 Nontoxic goiter, unspecified: Secondary | ICD-10-CM

## 2017-04-26 DIAGNOSIS — E118 Type 2 diabetes mellitus with unspecified complications: Principal | ICD-10-CM

## 2017-04-29 ENCOUNTER — Ambulatory Visit (HOSPITAL_COMMUNITY)
Admission: RE | Admit: 2017-04-29 | Discharge: 2017-04-29 | Disposition: A | Discharge status: Home / Self Care | Payer: Medicaid Other | Source: Ambulatory Visit | Attending: "Endocrinology | Admitting: "Endocrinology

## 2017-04-29 DIAGNOSIS — E049 Nontoxic goiter, unspecified: Secondary | ICD-10-CM | POA: Insufficient documentation

## 2017-04-29 DIAGNOSIS — E118 Type 2 diabetes mellitus with unspecified complications: Secondary | ICD-10-CM | POA: Insufficient documentation

## 2017-04-29 DIAGNOSIS — E1165 Type 2 diabetes mellitus with hyperglycemia: Secondary | ICD-10-CM | POA: Insufficient documentation

## 2017-04-29 DIAGNOSIS — IMO0002 Reserved for concepts with insufficient information to code with codable children: Secondary | ICD-10-CM

## 2017-05-06 ENCOUNTER — Ambulatory Visit (INDEPENDENT_AMBULATORY_CARE_PROVIDER_SITE_OTHER): Payer: Medicaid Other | Admitting: "Endocrinology

## 2017-05-06 ENCOUNTER — Encounter: Payer: Self-pay | Admitting: "Endocrinology

## 2017-05-06 VITALS — BP 118/79 | HR 83 | Ht 61.0 in | Wt 182.0 lb

## 2017-05-06 DIAGNOSIS — E1165 Type 2 diabetes mellitus with hyperglycemia: Secondary | ICD-10-CM | POA: Diagnosis not present

## 2017-05-06 DIAGNOSIS — E042 Nontoxic multinodular goiter: Secondary | ICD-10-CM | POA: Diagnosis not present

## 2017-05-06 DIAGNOSIS — E782 Mixed hyperlipidemia: Secondary | ICD-10-CM | POA: Diagnosis not present

## 2017-05-06 DIAGNOSIS — F17200 Nicotine dependence, unspecified, uncomplicated: Secondary | ICD-10-CM | POA: Diagnosis not present

## 2017-05-06 DIAGNOSIS — Z6835 Body mass index (BMI) 35.0-35.9, adult: Secondary | ICD-10-CM | POA: Diagnosis not present

## 2017-05-06 DIAGNOSIS — E118 Type 2 diabetes mellitus with unspecified complications: Secondary | ICD-10-CM | POA: Diagnosis not present

## 2017-05-06 DIAGNOSIS — IMO0002 Reserved for concepts with insufficient information to code with codable children: Secondary | ICD-10-CM

## 2017-05-06 NOTE — Patient Instructions (Signed)

## 2017-05-06 NOTE — Progress Notes (Signed)
Subjective:    Patient ID: [name] [name], female    DOB: 12-Apr-1971.  she is being seen in Follow-up for management of currently uncontrolled symptomatic diabetes requested by  Lianne Moris, PA-C.   Past Medical History:  Diagnosis Date  . Anemia   . Anxiety   . Asthma   . COPD (chronic obstructive pulmonary disease) (HCC)   . Depression   . Fatty liver   . GERD (gastroesophageal reflux disease)   . Hx of gonorrhea   . Shortness of breath    Past Surgical History:  Procedure Laterality Date  . cesarean section x 3    . CHOLECYSTECTOMY    . DILITATION & CURRETTAGE/HYSTROSCOPY WITH THERMACHOICE ABLATION N/A 02/28/2013   Procedure: DILATATION & CURETTAGE/HYSTEROSCOPY WITH THERMACHOICE ABLATION;  Surgeon: Tilda Burrow, MD;  Location: [facility];  Service: Gynecology;  Laterality: N/A;  Total Ablation Therapy Time = 9 minutes 31 seconds  . EUS  04/06/2012   Procedure: ESOPHAGEAL ENDOSCOPIC ULTRASOUND (EUS) RADIAL;  Surgeon: Willis Modena, MD;  Location: [facility];  Service: Endoscopy;  Laterality: N/A;  . TONSILLECTOMY    . TUBAL LIGATION     Social History   Social History  . Marital status: Single    Spouse name: N/A  . Number of children: N/A  . Years of education: N/A   Social History Main Topics  . Smoking status: Current Every Day Smoker    Packs/day: 2.00    Years: 35.00    Types: Cigarettes  . Smokeless tobacco: Never Used     Comment: never done snuff or chewing tobacco.  . Alcohol use No  . Drug use: No  . Sexual activity: Not Currently   Other Topics Concern  . Not on file   Social History Narrative  . No narrative on file   Outpatient Encounter Prescriptions as of 05/06/2017  Medication Sig  . ALPRAZolam (XANAX) 1 MG tablet Take 1 mg by mouth at bedtime as needed for anxiety or sleep.  . busPIRone (BUSPAR) 15 MG tablet Take 30 mg by mouth 3 (three) times daily.   Marland Kitchen esomeprazole (NEXIUM) 40 MG capsule Take 40 mg by mouth daily before breakfast.   . flunisolide (AEROBID) 250 MCG/ACT inhaler Inhale 2 puffs into the lungs 2 (two) times daily.  Marland Kitchen HYDROcodone-acetaminophen (NORCO/VICODIN) 5-325 MG tablet Take 1 tablet by mouth every 4 (four) hours as needed.  Marland Kitchen ibuprofen (ADVIL,MOTRIN) 800 MG tablet Take 800 mg by mouth every 8 (eight) hours as needed for moderate pain.  . metFORMIN (GLUCOPHAGE) 500 MG tablet Take 1 tablet (500 mg total) by mouth 2 (two) times daily with a meal.  . Naproxen Sodium (ALEVE PO) Take 1 tablet by mouth every 8 (eight) hours as needed (pain).   Marland Kitchen sitaGLIPtin (JANUVIA) 50 MG tablet Take 50 mg by mouth daily.  . Vilazodone HCl (VIIBRYD) 40 MG TABS Take 40 mg by mouth daily.   No facility-administered encounter medications on file as of 05/06/2017.     ALLERGIES: Allergies  Allergen Reactions  . Codeine Nausea And Vomiting  . Sulfa Antibiotics Nausea And Vomiting    VACCINATION STATUS:  There is no immunization history on file for this patient.  Diabetes  She presents for her follow-up diabetic visit. She has type 2 diabetes mellitus. Onset time: She was diagnosed at approximate age of 44 years. Her disease course has been worsening (She reports A1c of 10.2% from 2 months ago.). There are no hypoglycemic associated symptoms. Pertinent  negatives for hypoglycemia include no confusion, headaches, pallor or seizures. Associated symptoms include blurred vision, fatigue, polydipsia and polyuria. Pertinent negatives for diabetes include no chest pain and no polyphagia. There are no hypoglycemic complications. Symptoms are worsening. There are no diabetic complications. Risk factors for coronary artery disease include dyslipidemia, diabetes mellitus, obesity, sedentary lifestyle and tobacco exposure. Current diabetic treatment includes oral agent (dual therapy). Her weight is decreasing steadily. She is following a generally unhealthy diet. When asked about meal planning, she reported none. She has not had a previous visit  with a dietitian. She never participates in exercise. (She came with a log showing suspicious blood glucose readings. Her meter shows on his 5 readings in the last 7 days. ) An ACE inhibitor/angiotensin II receptor blocker is not being taken. She sees a podiatrist.Eye exam is not current.  Hyperlipidemia  This is a chronic problem. The current episode started more than 1 year ago. The problem is uncontrolled. Recent lipid tests were reviewed and are high (Her LDL from December was 127). Exacerbating diseases include diabetes and obesity. Pertinent negatives include no chest pain, myalgias or shortness of breath. She is currently on no antihyperlipidemic treatment. Compliance problems include medication cost and psychosocial issues.  Risk factors for coronary artery disease include a sedentary lifestyle, obesity, dyslipidemia and diabetes mellitus.    Review of Systems  Constitutional: Positive for fatigue. Negative for chills, fever and unexpected weight change.  HENT: Negative for trouble swallowing and voice change.   Eyes: Positive for blurred vision. Negative for visual disturbance.  Respiratory: Negative for cough, shortness of breath and wheezing.   Cardiovascular: Negative for chest pain, palpitations and leg swelling.  Gastrointestinal: Negative for diarrhea, nausea and vomiting.  Endocrine: Positive for polydipsia and polyuria. Negative for cold intolerance, heat intolerance and polyphagia.  Musculoskeletal: Negative for arthralgias and myalgias.  Skin: Negative for color change, pallor, rash and wound.  Neurological: Negative for seizures and headaches.  Psychiatric/Behavioral: Negative for confusion and suicidal ideas.    Objective:    BP 118/79   Pulse 83   Ht  (1.549 m)   Wt 182 lb (82.6 kg)   BMI 34.39 kg/m   Wt Readings from Last 3 Encounters:  05/06/17 182 lb (82.6 kg)  04/22/17 186 lb (84.4 kg)  03/31/17 192 lb (87.1 kg)     Physical Exam  Constitutional: She  is oriented to person, place, and time. She appears well-developed.  HENT:  Head: Normocephalic and atraumatic.  Eyes: EOM are normal.  Neck: Normal range of motion. Neck supple. No tracheal deviation present. No thyromegaly present.  Cardiovascular: Normal rate and regular rhythm.   Pulmonary/Chest: Effort normal and breath sounds normal.  Abdominal: Soft. Bowel sounds are normal. There is no tenderness. There is no guarding.  Musculoskeletal: Normal range of motion. She exhibits no edema.  Neurological: She is alert and oriented to person, place, and time. She has normal reflexes. No cranial nerve deficit. Coordination normal.  Skin: Skin is warm and dry. No rash noted. No erythema. No pallor.  Extensive tattoos.  Psychiatric: She has a normal mood and affect. Judgment normal.   CMP ( most recent) CMP     Component Value Date/Time   NA 134 (L) 03/31/2017 1423   K 3.6 03/31/2017 1423   CL 99 (L) 03/31/2017 1423   CO2 28 03/31/2017 1423   GLUCOSE 241 (H) 03/31/2017 1423   BUN 6 03/31/2017 1423   CREATININE 0.69 03/31/2017 1423   CALCIUM  9.2 03/31/2017 1423   PROT 7.4 03/31/2017 1423   ALBUMIN 3.6 03/31/2017 1423   AST 120 (H) 03/31/2017 1423   ALT 77 (H) 03/31/2017 1423   ALKPHOS 140 (H) 03/31/2017 1423   BILITOT 0.4 03/31/2017 1423   GFRNONAA >60 03/31/2017 1423   GFRAA >60 03/31/2017 1423    Diabetic Labs (most recent): Lab Results  Component Value Date   HGBA1C 10.2 03/05/2017   HGBA1C 6 08/07/2016   HGBA1C 7.6 04/26/2016     Lipid Panel ( most recent) Lipid Panel     Component Value Date/Time   CHOL 194 08/07/2016   TRIG 167 (A) 08/07/2016   HDL 34 (A) 08/07/2016   LDLCALC 127 08/07/2016      Assessment & Plan:   1. Uncontrolled type 2 diabetes mellitus with complication, without long-term current use of insulin (HCC)  - Patient has currently uncontrolled symptomatic type 2 DM since  46 years of age,  with most recent reported A1c of 10 .2 %. Recent labs  reviewed. - She was supposed to return with 4 tests daily, and volume was 5 readings in the last 7 days. Unfortunately, she documented suspicious blood glucose readings in her logs. - She does not report gross complications from diabetes, however, Ceri S Lillo remains at a high risk for more acute and chronic complications which include CAD, CVA, CKD, retinopathy, and neuropathy. These are all discussed in detail with the patient.  - I have counseled her on diet management and weight loss, by adopting a carbohydrate restricted/protein rich diet.  - Suggestion is made for her to avoid simple carbohydrates  from her diet including Cakes, Sweet Desserts, Ice Cream, Soda (diet and regular), Sweet Tea, Candies, Chips, Cookies, Store Bought Juices, Alcohol in Excess of  1-2 drinks a day, Artificial Sweeteners, and "Sugar-free" Products. This will help patient to have stable blood glucose profile and potentially avoid unintended weight gain.  - I encouraged her to switch to  unprocessed or minimally processed complex starch and increased protein intake (animal or plant source), fruits, and vegetables.  - she is advised to stick to a routine mealtimes to eat 3 meals  a day and avoid unnecessary snacks ( to snack only to correct hypoglycemia).   - she will be scheduled with Norm Salt, RDN, CDE for individualized DM education- consult pending.  - I have approached her with the following individualized plan to manage diabetes and patient agrees:   -  She may require at least basal insulin to treat her diabetes to target, however it is essential to assure her commitment for proper monitoring for safe use of insulin. - I  urged her to initiate strict monitoring of glucose 4 times a day-before meals and at bedtime and she is asked to return in one week with her logs and meter.  -Patient is encouraged to call clinic for blood glucose levels less than 70 or above 300 mg /dl. - I will continue Januvia 50  minute grams by mouth daily, therapeutically suitable for patient . - I will continue  low-dose metformin, 500 mg by mouth twice a day.  - she will be considered for incretin therapy as appropriate next visit. - Patient specific target  A1c;  LDL, HDL, Triglycerides, and  Waist Circumference were discussed in detail.  2) BP/HTN: Controlled. She is not on an ACE inhibitor nor ARB, will be considered on subsequent visit.  3) Lipids/HPL:   Uncontrolled with LDL 127. patient is hesitant to go  on statin, she will be approached again with repeat fasting lipid panel.  4)  Weight/Diet: CDE Consult will be initiated , exercise, and detailed carbohydrates information provided.  5) Multinodular goiter: Ultrasound confirms 1.3 cm nodule on the isthmus, no suspicious features. - She will not require fine-needle aspiration at this time.  6) Chronic Care/Health Maintenance:  -she  Is not  on ACEI/ARB and Statin medications,   is encouraged to continue to follow up with Ophthalmology, Dentist,  Podiatrist at least yearly or according to recommendations, and advised to Quit  Smoking. I have recommended yearly flu vaccine and pneumonia vaccination at least every 5 years; moderate intensity exercise for up to 150 minutes weekly; and  sleep for at least 7 hours a day.  - Time spent with the patient: 25 min, of which >50% was spent in reviewing her sugar logs , discussing her hypo- and hyper-glycemic episodes, reviewing her current and  previous labs and insulin doses and developing a plan to avoid hypo- and hyper-glycemia.   - Patient to bring meter and  blood glucose logs during her next visit.  - I advised patient to maintain close follow up with Lianne Moris, PA-C for primary care needs.  Follow up plan: - Return in about 1 week (around 05/13/2017) for follow up with meter and logs- no labs.  Marquis Lunch, MD Phone: [phone]  Fax: [phone]   05/06/2017, 11:29 AM   This note was partially dictated  with voice recognition software. Similar sounding words can be transcribed inadequately or may not  be corrected upon review.

## 2017-05-17 ENCOUNTER — Encounter: Payer: Self-pay | Admitting: "Endocrinology

## 2017-05-17 ENCOUNTER — Ambulatory Visit (INDEPENDENT_AMBULATORY_CARE_PROVIDER_SITE_OTHER): Payer: Medicaid Other | Admitting: "Endocrinology

## 2017-05-17 VITALS — BP 118/77 | HR 73 | Ht 61.0 in | Wt 179.0 lb

## 2017-05-17 DIAGNOSIS — IMO0002 Reserved for concepts with insufficient information to code with codable children: Secondary | ICD-10-CM

## 2017-05-17 DIAGNOSIS — E1165 Type 2 diabetes mellitus with hyperglycemia: Secondary | ICD-10-CM | POA: Diagnosis not present

## 2017-05-17 DIAGNOSIS — E118 Type 2 diabetes mellitus with unspecified complications: Secondary | ICD-10-CM

## 2017-05-17 DIAGNOSIS — F17200 Nicotine dependence, unspecified, uncomplicated: Secondary | ICD-10-CM

## 2017-05-17 DIAGNOSIS — E782 Mixed hyperlipidemia: Secondary | ICD-10-CM

## 2017-05-17 DIAGNOSIS — Z6835 Body mass index (BMI) 35.0-35.9, adult: Secondary | ICD-10-CM | POA: Diagnosis not present

## 2017-05-17 NOTE — Patient Instructions (Signed)

## 2017-05-17 NOTE — Progress Notes (Signed)
Subjective:    Patient ID: [name] [name], female    DOB: 12/08/1970.  she is being seen in Follow-up for management of currently uncontrolled symptomatic diabetes requested by  Lianne Moris, PA-C.   Past Medical History:  Diagnosis Date  . Anemia   . Anxiety   . Asthma   . COPD (chronic obstructive pulmonary disease) (HCC)   . Depression   . Fatty liver   . GERD (gastroesophageal reflux disease)   . Hx of gonorrhea   . Shortness of breath    Past Surgical History:  Procedure Laterality Date  . cesarean section x 3    . CHOLECYSTECTOMY    . DILITATION & CURRETTAGE/HYSTROSCOPY WITH THERMACHOICE ABLATION N/A 02/28/2013   Procedure: DILATATION & CURETTAGE/HYSTEROSCOPY WITH THERMACHOICE ABLATION;  Surgeon: Tilda Burrow, MD;  Location: [facility];  Service: Gynecology;  Laterality: N/A;  Total Ablation Therapy Time = 9 minutes 31 seconds  . EUS  04/06/2012   Procedure: ESOPHAGEAL ENDOSCOPIC ULTRASOUND (EUS) RADIAL;  Surgeon: Willis Modena, MD;  Location: [facility];  Service: Endoscopy;  Laterality: N/A;  . TONSILLECTOMY    . TUBAL LIGATION     Social History   Social History  . Marital status: Single    Spouse name: N/A  . Number of children: N/A  . Years of education: N/A   Social History Main Topics  . Smoking status: Current Every Day Smoker    Packs/day: 2.00    Years: 35.00    Types: Cigarettes  . Smokeless tobacco: Never Used     Comment: never done snuff or chewing tobacco.  . Alcohol use No  . Drug use: No  . Sexual activity: Not Currently   Other Topics Concern  . None   Social History Narrative  . None   Outpatient Encounter Prescriptions as of 05/17/2017  Medication Sig  . ALPRAZolam (XANAX) 1 MG tablet Take 1 mg by mouth at bedtime as needed for anxiety or sleep.  . busPIRone (BUSPAR) 15 MG tablet Take 30 mg by mouth 3 (three) times daily.   Marland Kitchen esomeprazole (NEXIUM) 40 MG capsule Take 40 mg by mouth daily before breakfast.  . flunisolide  (AEROBID) 250 MCG/ACT inhaler Inhale 2 puffs into the lungs 2 (two) times daily.  Marland Kitchen HYDROcodone-acetaminophen (NORCO/VICODIN) 5-325 MG tablet Take 1 tablet by mouth every 4 (four) hours as needed.  Marland Kitchen ibuprofen (ADVIL,MOTRIN) 800 MG tablet Take 800 mg by mouth every 8 (eight) hours as needed for moderate pain.  . metFORMIN (GLUCOPHAGE) 500 MG tablet Take 1 tablet (500 mg total) by mouth 2 (two) times daily with a meal.  . Naproxen Sodium (ALEVE PO) Take 1 tablet by mouth every 8 (eight) hours as needed (pain).   Marland Kitchen sitaGLIPtin (JANUVIA) 50 MG tablet Take 50 mg by mouth daily.  . Vilazodone HCl (VIIBRYD) 40 MG TABS Take 40 mg by mouth daily.   No facility-administered encounter medications on file as of 05/17/2017.     ALLERGIES: Allergies  Allergen Reactions  . Codeine Nausea And Vomiting  . Sulfa Antibiotics Nausea And Vomiting    VACCINATION STATUS:  There is no immunization history on file for this patient.  Diabetes  She presents for her follow-up diabetic visit. She has type 2 diabetes mellitus. Onset time: She was diagnosed at approximate age of 44 years. Her disease course has been worsening (She reports A1c of 10.2% from 2 months ago.). There are no hypoglycemic associated symptoms. Pertinent negatives for hypoglycemia include no  confusion, headaches, pallor or seizures. Associated symptoms include fatigue. Pertinent negatives for diabetes include no blurred vision, no chest pain, no polydipsia, no polyphagia and no polyuria. There are no hypoglycemic complications. Symptoms are worsening. There are no diabetic complications. Risk factors for coronary artery disease include dyslipidemia, diabetes mellitus, obesity, sedentary lifestyle and tobacco exposure. Current diabetic treatment includes oral agent (dual therapy). Her weight is decreasing steadily. She is following a generally unhealthy diet. When asked about meal planning, she reported none. She has not had a previous visit with a  dietitian. She never participates in exercise. Her breakfast blood glucose range is generally 110-130 mg/dl. Her lunch blood glucose range is generally 110-130 mg/dl. Her dinner blood glucose range is generally 110-130 mg/dl. Her bedtime blood glucose range is generally 110-130 mg/dl. Her overall blood glucose range is 110-130 mg/dl. An ACE inhibitor/angiotensin II receptor blocker is not being taken. She sees a podiatrist.Eye exam is not current.  Hyperlipidemia  This is a chronic problem. The current episode started more than 1 year ago. The problem is uncontrolled. Recent lipid tests were reviewed and are high (Her LDL from December was 127). Exacerbating diseases include diabetes and obesity. Pertinent negatives include no chest pain, myalgias or shortness of breath. She is currently on no antihyperlipidemic treatment. Compliance problems include medication cost and psychosocial issues.  Risk factors for coronary artery disease include a sedentary lifestyle, obesity, dyslipidemia and diabetes mellitus.    Review of Systems  Constitutional: Positive for fatigue. Negative for chills, fever and unexpected weight change.  HENT: Negative for trouble swallowing and voice change.   Eyes: Negative for blurred vision and visual disturbance.  Respiratory: Negative for cough, shortness of breath and wheezing.   Cardiovascular: Negative for chest pain, palpitations and leg swelling.  Gastrointestinal: Negative for diarrhea, nausea and vomiting.  Endocrine: Negative for cold intolerance, heat intolerance, polydipsia, polyphagia and polyuria.  Musculoskeletal: Negative for arthralgias and myalgias.  Skin: Negative for color change, pallor, rash and wound.  Neurological: Negative for seizures and headaches.  Psychiatric/Behavioral: Negative for confusion and suicidal ideas.    Objective:    BP 118/77   Pulse 73   Ht  (1.549 m)   Wt 179 lb (81.2 kg)   BMI 33.82 kg/m   Wt Readings from Last 3  Encounters:  05/17/17 179 lb (81.2 kg)  05/06/17 182 lb (82.6 kg)  04/22/17 186 lb (84.4 kg)     Physical Exam  Constitutional: She is oriented to person, place, and time. She appears well-developed.  HENT:  Head: Normocephalic and atraumatic.  Eyes: EOM are normal.  Neck: Normal range of motion. Neck supple. No tracheal deviation present. No thyromegaly present.  Cardiovascular: Normal rate and regular rhythm.   Pulmonary/Chest: Effort normal and breath sounds normal.  Abdominal: Soft. Bowel sounds are normal. There is no tenderness. There is no guarding.  Musculoskeletal: Normal range of motion. She exhibits no edema.  Neurological: She is alert and oriented to person, place, and time. She has normal reflexes. No cranial nerve deficit. Coordination normal.  Skin: Skin is warm and dry. No rash noted. No erythema. No pallor.  Extensive tattoos.  Psychiatric: She has a normal mood and affect. Judgment normal.   CMP ( most recent) CMP     Component Value Date/Time   NA 134 (L) 03/31/2017 1423   K 3.6 03/31/2017 1423   CL 99 (L) 03/31/2017 1423   CO2 28 03/31/2017 1423   GLUCOSE 241 (H) 03/31/2017 1423  BUN 6 03/31/2017 1423   CREATININE 0.69 03/31/2017 1423   CALCIUM 9.2 03/31/2017 1423   PROT 7.4 03/31/2017 1423   ALBUMIN 3.6 03/31/2017 1423   AST 120 (H) 03/31/2017 1423   ALT 77 (H) 03/31/2017 1423   ALKPHOS 140 (H) 03/31/2017 1423   BILITOT 0.4 03/31/2017 1423   GFRNONAA >60 03/31/2017 1423   GFRAA >60 03/31/2017 1423    Diabetic Labs (most recent): Lab Results  Component Value Date   HGBA1C 10.2 03/05/2017   HGBA1C 6 08/07/2016   HGBA1C 7.6 04/26/2016     Lipid Panel ( most recent) Lipid Panel     Component Value Date/Time   CHOL 194 08/07/2016   TRIG 167 (A) 08/07/2016   HDL 34 (A) 08/07/2016   LDLCALC 127 08/07/2016      Assessment & Plan:   1. Uncontrolled type 2 diabetes mellitus with complication, without long-term current use of insulin  (HCC)  - Patient has currently uncontrolled symptomatic type 2 DM since  46 years of age,  with most recent reported A1c of 10 .2 %. Recent labs reviewed. - She came with significant improvement in her glucose profile, averaging 119 in the last 7 days.  - She does not report gross complications from diabetes, however, Raysa S Burdine remains at a high risk for more acute and chronic complications which include CAD, CVA, CKD, retinopathy, and neuropathy. These are all discussed in detail with the patient.  - I have counseled her on diet management and weight loss, by adopting a carbohydrate restricted/protein rich diet.  -  Suggestion is made for her to avoid simple carbohydrates  from her diet including Cakes, Sweet Desserts / Pastries, Ice Cream, Soda (diet and regular), Sweet Tea, Candies, Chips, Cookies, Store Bought Juices, Alcohol in Excess of  1-2 drinks a day, Artificial Sweeteners, and "Sugar-free" Products. This will help patient to have stable blood glucose profile and potentially avoid unintended weight gain.   - I encouraged her to switch to  unprocessed or minimally processed complex starch and increased protein intake (animal or plant source), fruits, and vegetables.  - she is advised to stick to a routine mealtimes to eat 3 meals  a day and avoid unnecessary snacks ( to snack only to correct hypoglycemia).   - she will be scheduled with Norm Salt, RDN, CDE for individualized DM education- consult pending.  - I have approached her with the following individualized plan to manage diabetes and patient agrees:   -  Based on her glycemic profile, she would not require insulin treatment for now.  - I will continue Januvia 50 mg by mouth daily, therapeutically suitable for patient . - I will continue  low-dose metformin, 500 mg by mouth twice a day.  - Patient specific target  A1c;  LDL, HDL, Triglycerides, and  Waist Circumference were discussed in detail.  2) BP/HTN:  Controlled. She is not on an ACE inhibitor nor ARB, will be considered on subsequent visit.  3) Lipids/HPL:   Uncontrolled with LDL 127. patient is hesitant to go on statin, she will be approached again with repeat fasting lipid panel.  4)  Weight/Diet: CDE Consult will be initiated , exercise, and detailed carbohydrates information provided.  5) Multinodular goiter: Ultrasound confirms 1.3 cm nodule on the isthmus, no suspicious features. - She will not require fine-needle aspiration at this time.  6) Chronic Care/Health Maintenance:  -she  Is not  on ACEI/ARB and Statin medications,   is encouraged to continue  to follow up with Ophthalmology, Dentist,  Podiatrist at least yearly or according to recommendations, and advised to Quit  Smoking. I have recommended yearly flu vaccine and pneumonia vaccination at least every 5 years; moderate intensity exercise for up to 150 minutes weekly; and  sleep for at least 7 hours a day.  - Time spent with the patient: 25 min, of which >50% was spent in reviewing her sugar logs , discussing her hypo- and hyper-glycemic episodes, reviewing her current and  previous labs and insulin doses and developing a plan to avoid hypo- and hyper-glycemia.    - I advised patient to maintain close follow up with Lianne Moris, PA-C for primary care needs.  Follow up plan: - Return in about 5 weeks (around 06/21/2017) for follow up with pre-visit labs.  Marquis Lunch, MD Phone: [phone]  Fax: [phone]   05/17/2017, 11:38 AM   This note was partially dictated with voice recognition software. Similar sounding words can be transcribed inadequately or may not  be corrected upon review.

## 2017-05-31 ENCOUNTER — Encounter: Payer: Medicaid Other | Attending: "Endocrinology | Admitting: Nutrition

## 2017-05-31 VITALS — Ht 61.0 in | Wt 183.0 lb

## 2017-05-31 DIAGNOSIS — E669 Obesity, unspecified: Secondary | ICD-10-CM

## 2017-05-31 DIAGNOSIS — IMO0002 Reserved for concepts with insufficient information to code with codable children: Secondary | ICD-10-CM

## 2017-05-31 DIAGNOSIS — Z713 Dietary counseling and surveillance: Secondary | ICD-10-CM | POA: Diagnosis not present

## 2017-05-31 DIAGNOSIS — E118 Type 2 diabetes mellitus with unspecified complications: Secondary | ICD-10-CM | POA: Insufficient documentation

## 2017-05-31 DIAGNOSIS — E1165 Type 2 diabetes mellitus with hyperglycemia: Secondary | ICD-10-CM | POA: Diagnosis not present

## 2017-05-31 NOTE — Progress Notes (Signed)
  Medical Nutrition Therapy:  Appt start time: 0800 end time:  0900.   Assessment:  Primary concerns today: [name] [name] Type 2 DM. X 1 yr. Has3 children; set of twins who are autistic.  Marland Kitchen Metformin 500 mg BID and Januvia.  She does the cooking and shopping. SHe is not working. Gets food stamps. Stuggles with eating healthy on a limited budget.  BS this am was 121 mg/dl. BS are much better. She has cut out sweets and junk food. She eats meal per day.  Has quit. drinking Sun Drop. Testing blood sugars occasionally.  Just  started seeing Dr. Dorris Fetch, Endocrinologist for her diabetes. Smokes 2 ppd. Working on quitting. Not exericing. Motivated to make lifestyle choices to improve her health. Diet is inconsistent to meet her needs.   Preferred Learning Style:   Auditory  Hands on  No preference indicated    Readiness to change  Ready  Change in progress   MEDICATIONS: see list   DIETARY INTAKE:  24-hr recall:  B ( AM): HOney nuts and oats with whole or boiled egg 1-2, water or OJ. Snk ( AM):   L ( PM): PB sandwich on white bead,  water Snk ( PM):  D ( PM): Fried chicken, mac/cheese, turnip greens, Sweet tea Snk ( PM):  Beverages: water.   Usual physical activity:  Estimated energy needs: 1200  calories 135 g carbohydrates 90 g protein 33 g fat  Progress Towards Goal(s):  In progress.   Nutritional Diagnosis:  NB-1.1 Food and nutrition-related knowledge deficit As related to Diabetes.  As evidenced by A1C 10.3%.    Intervention:  Nutrition and Diabetes education provided on My Plate, CHO counting, meal planning, portion sizes, timing of meals, avoiding snacks between meals unless having a low blood sugar, target ranges for A1C and blood sugars, signs/symptoms and treatment of hyper/hypoglycemia, monitoring blood sugars, taking medications as prescribed, benefits of exercising 30 minutes per day and prevention of complications of DM.  Goals  1 . Follow  Plate  Method Cut out sodas and diet sodas 2. Choose 2% milk 3. Choose wheat bread 4. Eat 2-3 carb choices per meal 5 Walk 30 minutes a day. Test BS in am and at night. Lose 1 lb per week   Teaching Method Utilized:  Visual Auditory Hands on  Handouts given during visit include:  My Plate  Meal Plan    Barriers to learning/adherence to lifestyle change: none  Demonstrated degree of understanding via:  Teach Back   Monitoring/Evaluation:  Dietary intake, exercise, meal planing, and body weight in 1 month(s).

## 2017-05-31 NOTE — Patient Instructions (Addendum)
Goals 1 . Follow  Plate Method Cut out sodas and diet sodas 2. Choose 2% milk 3. Choose wheat bread 4. Eat 2-3 carb choices per meal 5 Walk 30 minutes a day. Test BS in am and at night. Lose 1 lb per week

## 2017-06-15 LAB — COMPLETE METABOLIC PANEL WITH GFR
AG Ratio: 1.2 (calc) (ref 1.0–2.5)
ALBUMIN MSPROF: 3.8 g/dL (ref 3.6–5.1)
ALKALINE PHOSPHATASE (APISO): 132 U/L — AB (ref 33–115)
ALT: 28 U/L (ref 6–29)
AST: 23 U/L (ref 10–35)
BILIRUBIN TOTAL: 0.3 mg/dL (ref 0.2–1.2)
BUN / CREAT RATIO: 7 (calc) (ref 6–22)
BUN: 5 mg/dL — ABNORMAL LOW (ref 7–25)
CHLORIDE: 104 mmol/L (ref 98–110)
CO2: 30 mmol/L (ref 20–32)
Calcium: 9 mg/dL (ref 8.6–10.2)
Creat: 0.67 mg/dL (ref 0.50–1.10)
GFR, Est African American: 121 mL/min/{1.73_m2} (ref >=60)
GFR, Est Non African American: 105 mL/min/{1.73_m2} (ref >=60)
GLOBULIN: 3.2 g/dL (ref 1.9–3.7)
GLUCOSE: 103 mg/dL — AB (ref 65–99)
Potassium: 4.3 mmol/L (ref 3.5–5.3)
SODIUM: 140 mmol/L (ref 135–146)
Total Protein: 7 g/dL (ref 6.1–8.1)

## 2017-06-15 LAB — LIPID PANEL
Cholesterol: 187 mg/dL (ref <200)
HDL: 27 mg/dL — ABNORMAL LOW (ref >50)
LDL Cholesterol (Calc): 129 mg/dL (calc) — ABNORMAL HIGH
NON-HDL CHOLESTEROL (CALC): 160 mg/dL — AB (ref <130)
Total CHOL/HDL Ratio: 6.9 (calc) — ABNORMAL HIGH (ref <5.0)
Triglycerides: 175 mg/dL — ABNORMAL HIGH (ref <150)

## 2017-06-15 LAB — TSH: TSH: 0.81 m[IU]/L

## 2017-06-15 LAB — MICROALBUMIN / CREATININE URINE RATIO
Creatinine, Urine: 34 mg/dL (ref 20–275)
Microalb, Ur: <0.2 mg/dL

## 2017-06-15 LAB — HEMOGLOBIN A1C
HEMOGLOBIN A1C: 6.5 %{Hb} — AB (ref <5.7)
MEAN PLASMA GLUCOSE: 140 (calc)
eAG (mmol/L): 7.7 (calc)

## 2017-06-15 LAB — VITAMIN D 25 HYDROXY (VIT D DEFICIENCY, FRACTURES): Vit D, 25-Hydroxy: 23 ng/mL — ABNORMAL LOW (ref 30–100)

## 2017-06-15 LAB — T4, FREE: FREE T4: 0.9 ng/dL (ref 0.8–1.8)

## 2017-06-21 ENCOUNTER — Encounter: Payer: Self-pay | Admitting: "Endocrinology

## 2017-06-21 ENCOUNTER — Encounter: Payer: Self-pay | Admitting: Nutrition

## 2017-06-21 ENCOUNTER — Ambulatory Visit (INDEPENDENT_AMBULATORY_CARE_PROVIDER_SITE_OTHER): Payer: Medicaid Other | Admitting: "Endocrinology

## 2017-06-21 ENCOUNTER — Encounter: Payer: Medicaid Other | Attending: "Endocrinology | Admitting: Nutrition

## 2017-06-21 VITALS — BP 128/86 | HR 69 | Ht 61.0 in | Wt 180.0 lb

## 2017-06-21 VITALS — Ht 61.5 in | Wt 180.0 lb

## 2017-06-21 DIAGNOSIS — Z713 Dietary counseling and surveillance: Secondary | ICD-10-CM | POA: Diagnosis present

## 2017-06-21 DIAGNOSIS — E118 Type 2 diabetes mellitus with unspecified complications: Secondary | ICD-10-CM | POA: Insufficient documentation

## 2017-06-21 DIAGNOSIS — E782 Mixed hyperlipidemia: Secondary | ICD-10-CM | POA: Diagnosis not present

## 2017-06-21 DIAGNOSIS — IMO0002 Reserved for concepts with insufficient information to code with codable children: Secondary | ICD-10-CM

## 2017-06-21 DIAGNOSIS — Z6834 Body mass index (BMI) 34.0-34.9, adult: Secondary | ICD-10-CM

## 2017-06-21 DIAGNOSIS — E1165 Type 2 diabetes mellitus with hyperglycemia: Secondary | ICD-10-CM | POA: Insufficient documentation

## 2017-06-21 DIAGNOSIS — F17200 Nicotine dependence, unspecified, uncomplicated: Secondary | ICD-10-CM

## 2017-06-21 DIAGNOSIS — E669 Obesity, unspecified: Secondary | ICD-10-CM

## 2017-06-21 DIAGNOSIS — E6609 Other obesity due to excess calories: Secondary | ICD-10-CM | POA: Diagnosis not present

## 2017-06-21 MED ORDER — VITAMIN D3 125 MCG (5000 UT) PO CAPS: 5000.0000 [IU] | ORAL_CAPSULE | Freq: Every day | ORAL | 0 refills | Status: DC

## 2017-06-21 MED ORDER — ATORVASTATIN CALCIUM 20 MG PO TABS: 20.0000 mg | ORAL_TABLET | Freq: Every day | ORAL | 3 refills | Status: AC

## 2017-06-21 NOTE — Patient Instructions (Signed)

## 2017-06-21 NOTE — Progress Notes (Signed)
  Medical Nutrition Therapy:  Appt start time: 930 end time:  1000  Assessment:  Primary concerns today: Dibabetes Type 2 DM. X 1 yr. Has 3 children; set of twins who are autistic.  [name] [name]. Metformin 500 mg BID and Januvia.  Changes made: drinking more water and cut out fried. Eating healthier foods of more fresh fruits and vegetables.  A1C down to 6.5%. She has lost a total of 12 lbs in 3 months. Feels much better. Has more energy and not as tired.  Preferred Learning Style:   Auditory  Hands on   Readiness to change  Ready  Change in progress   MEDICATIONS: see list   DIETARY INTAKE:  24-hr recall:  B ( AM): Rasin bran and milk, Snk ( AM):   L ( PM): rice cakes, pb or PB sandwich and fruit, water. Snk ( PM):  D ( PM): [place] ground taco meat, 3, lettuce, tomato, water Snk ( PM):  Beverages: water.   Usual physical activity:  Estimated energy needs: 1200  calories 135 g carbohydrates 90 g protein 33 g fat  Progress Towards Goal(s):  In progress.   Nutritional Diagnosis:  NB-1.1 Food and nutrition-related knowledge deficit As related to Diabetes.  As evidenced by A1C 10.3%.    Intervention:  Nutrition and Diabetes education provided on My Plate, CHO counting, meal planning, portion sizes, timing of meals, avoiding snacks between meals unless having a low blood sugar, target ranges for A1C and blood sugars, signs/symptoms and treatment of hyper/hypoglycemia, monitoring blood sugars, taking medications as prescribed, benefits of exercising 30 minutes per day and prevention of complications of DM.  Goals   Goals 1. Increase exercise to 30 minutes three times per week 2. Eat 2-3 carb choices per meal. 3. Keep drinking water  4. Increase fresh fruits and vegetables daily. 5. Keep A1C 6.5%. Or less.  Lose 1-2 lbs per week.   Teaching Method Utilized:  Visual Auditory Hands on  Handouts given during visit include:  My Plate  Meal Plan    Barriers to  learning/adherence to lifestyle change: none  Demonstrated degree of understanding via:  Teach Back   Monitoring/Evaluation:  Dietary intake, exercise, meal planing, and body weight in 3 month(s).

## 2017-06-21 NOTE — Patient Instructions (Signed)
Goals 1. Increase exercise to 30 minutes three times per week 2. Eat 2-3 carb choices per meal. 3. Keep drinking water  4. Increase fresh fruits and vegetables daily. 5. Keep A1C 6.5%. Or less.  Lose 1-2 lbs per week.

## 2017-06-21 NOTE — Progress Notes (Signed)
Subjective:    Patient ID: [name] [name], female    DOB: March 14, 1971.  she is being seen in Follow-up for management of currently uncontrolled symptomatic diabetes requested by  Lianne Morisarroll, Erin, PA-C.   Past Medical History:  Diagnosis Date  . Anemia   . Anxiety   . Asthma   . COPD (chronic obstructive pulmonary disease) (HCC)   . Depression   . Fatty liver   . GERD (gastroesophageal reflux disease)   . Hx of gonorrhea   . Shortness of breath    Past Surgical History:  Procedure Laterality Date  . cesarean section x 3    . CHOLECYSTECTOMY    . TONSILLECTOMY    . TUBAL LIGATION     Social History   Socioeconomic History  . Marital status: Single    Spouse name: None  . Number of children: None  . Years of education: None  . Highest education level: None  Social Needs  . Financial resource strain: None  . Food insecurity - worry: None  . Food insecurity - inability: None  . Transportation needs - medical: None  . Transportation needs - non-medical: None  Occupational History  . None  Tobacco Use  . Smoking status: Current Every Day Smoker    Packs/day: 2.00    Years: 35.00    Pack years: 70.00    Types: Cigarettes  . Smokeless tobacco: Never Used  . Tobacco comment: never done snuff or chewing tobacco.  Substance and Sexual Activity  . Alcohol use: No  . Drug use: No  . Sexual activity: Not Currently  Other Topics Concern  . None  Social History Narrative  . None   Outpatient Encounter Medications as of 06/21/2017  Medication Sig  . ALPRAZolam (XANAX) 1 MG tablet Take 1 mg by mouth at bedtime as needed for anxiety or sleep.  Marland Kitchen. atorvastatin (LIPITOR) 20 MG tablet Take 1 tablet (20 mg total) daily by mouth.  . busPIRone (BUSPAR) 15 MG tablet Take 30 mg by mouth 3 (three) times daily.   . Cholecalciferol (VITAMIN D3) 5000 units CAPS Take 1 capsule (5,000 Units total) daily by mouth.  . esomeprazole (NEXIUM) 40 MG capsule Take 40 mg by mouth daily before  breakfast.  . flunisolide (AEROBID) 250 MCG/ACT inhaler Inhale 2 puffs into the lungs 2 (two) times daily.  Marland Kitchen. ibuprofen (ADVIL,MOTRIN) 800 MG tablet Take 800 mg by mouth every 8 (eight) hours as needed for moderate pain.  . metFORMIN (GLUCOPHAGE) 500 MG tablet Take 1 tablet (500 mg total) by mouth 2 (two) times daily with a meal.  . Naproxen Sodium (ALEVE PO) Take 1 tablet by mouth every 8 (eight) hours as needed (pain).   Marland Kitchen. sitaGLIPtin (JANUVIA) 50 MG tablet Take 50 mg by mouth daily.  . Vilazodone HCl (VIIBRYD) 40 MG TABS Take 40 mg by mouth daily.  . [DISCONTINUED] HYDROcodone-acetaminophen (NORCO/VICODIN) 5-325 MG tablet Take 1 tablet by mouth every 4 (four) hours as needed. (Patient not taking: Reported on 05/31/2017)   No facility-administered encounter medications on file as of 06/21/2017.     ALLERGIES: Allergies  Allergen Reactions  . Codeine Nausea And Vomiting  . Sulfa Antibiotics Nausea And Vomiting    VACCINATION STATUS:  There is no immunization history on file for this patient.  Diabetes  She presents for her follow-up diabetic visit. She has type 2 diabetes mellitus. Onset time: She was diagnosed at approximate age of 44 years. Her disease course has been improving (  She reports A1c of 10.2% from 2 months ago.). There are no hypoglycemic associated symptoms. Pertinent negatives for hypoglycemia include no confusion, headaches, pallor or seizures. Associated symptoms include fatigue. Pertinent negatives for diabetes include no blurred vision, no chest pain, no polydipsia, no polyphagia and no polyuria. There are no hypoglycemic complications. Symptoms are improving. There are no diabetic complications. Risk factors for coronary artery disease include dyslipidemia, diabetes mellitus, obesity, sedentary lifestyle and tobacco exposure. Current diabetic treatment includes oral agent (dual therapy). Her weight is stable. She is following a generally unhealthy diet. When asked about  meal planning, she reported none. She has had a previous visit with a dietitian. She never participates in exercise. Her breakfast blood glucose range is generally 110-130 mg/dl. Her lunch blood glucose range is generally 110-130 mg/dl. Her dinner blood glucose range is generally 110-130 mg/dl. Her bedtime blood glucose range is generally 110-130 mg/dl. Her overall blood glucose range is 110-130 mg/dl. An ACE inhibitor/angiotensin II receptor blocker is not being taken. She sees a podiatrist.Eye exam is not current.  Hyperlipidemia  This is a chronic problem. The current episode started more than 1 year ago. The problem is uncontrolled. Recent lipid tests were reviewed and are high (Her LDL from December was 127). Exacerbating diseases include diabetes and obesity. Pertinent negatives include no chest pain, myalgias or shortness of breath. She is currently on no antihyperlipidemic treatment. Compliance problems include medication cost and psychosocial issues.  Risk factors for coronary artery disease include a sedentary lifestyle, obesity, dyslipidemia and diabetes mellitus.    Review of Systems  Constitutional: Positive for fatigue. Negative for chills, fever and unexpected weight change.  HENT: Negative for trouble swallowing and voice change.   Eyes: Negative for blurred vision and visual disturbance.  Respiratory: Negative for cough, shortness of breath and wheezing.   Cardiovascular: Negative for chest pain, palpitations and leg swelling.  Gastrointestinal: Negative for diarrhea, nausea and vomiting.  Endocrine: Negative for cold intolerance, heat intolerance, polydipsia, polyphagia and polyuria.  Musculoskeletal: Negative for arthralgias and myalgias.  Skin: Negative for color change, pallor, rash and wound.  Neurological: Negative for seizures and headaches.  Psychiatric/Behavioral: Negative for confusion and suicidal ideas.    Objective:    BP 128/86   Pulse 69   Ht 5\' 1"  (1.549 m)    Wt 180 lb (81.6 kg)   BMI 34.01 kg/m   Wt Readings from Last 3 Encounters:  06/21/17 180 lb (81.6 kg)  06/21/17 180 lb (81.6 kg)  05/31/17 183 lb (83 kg)     Physical Exam  Constitutional: She is oriented to person, place, and time. She appears well-developed.  HENT:  Head: Normocephalic and atraumatic.  Eyes: EOM are normal.  Neck: Normal range of motion. Neck supple. No tracheal deviation present. No thyromegaly present.  Cardiovascular: Normal rate and regular rhythm.  Pulmonary/Chest: Effort normal and breath sounds normal.  Abdominal: Soft. Bowel sounds are normal. There is no tenderness. There is no guarding.  Musculoskeletal: Normal range of motion. She exhibits no edema.  Neurological: She is alert and oriented to person, place, and time. She has normal reflexes. No cranial nerve deficit. Coordination normal.  Skin: Skin is warm and dry. No rash noted. No erythema. No pallor.  Extensive tattoos.  Psychiatric: She has a normal mood and affect. Judgment normal.   CMP ( most recent) CMP     Component Value Date/Time   NA 140 06/14/2017 0826   K 4.3 06/14/2017 0826   CL 104  06/14/2017 0826   CO2 30 06/14/2017 0826   GLUCOSE 103 (H) 06/14/2017 0826   BUN 5 (L) 06/14/2017 0826   CREATININE 0.67 06/14/2017 0826   CALCIUM 9.0 06/14/2017 0826   PROT 7.0 06/14/2017 0826   ALBUMIN 3.6 03/31/2017 1423   AST 23 06/14/2017 0826   ALT 28 06/14/2017 0826   ALKPHOS 140 (H) 03/31/2017 1423   BILITOT 0.3 06/14/2017 0826   GFRNONAA 105 06/14/2017 0826   GFRAA 121 06/14/2017 0826    Diabetic Labs (most recent): Lab Results  Component Value Date   HGBA1C 6.5 (H) 06/14/2017   HGBA1C 10.2 03/05/2017   HGBA1C 6 08/07/2016     Lipid Panel ( most recent) Lipid Panel     Component Value Date/Time   CHOL 187 06/14/2017 0826   TRIG 175 (H) 06/14/2017 0826   HDL 27 (L) 06/14/2017 0826   CHOLHDL 6.9 (H) 06/14/2017 0826   LDLCALC 127 08/07/2016      Assessment & Plan:   1.  Uncontrolled type 2 diabetes mellitus with complication, without long-term current use of insulin (HCC)  - Patient has currently uncontrolled symptomatic type 2 DM since  46 years of age. - She came with traumatic improvement in her glucose profile and A1c improved to 6.5% from 10 .2 %. Recent labs reviewed, showing significant dyslipidemia.  - She does not report gross complications from diabetes, however, given her history of heavy smoking, Tashina S Koehne remains at a high risk for more acute and chronic complications which include CAD, CVA, CKD, retinopathy, and neuropathy. These are all discussed in detail with the patient.  - I have counseled her on diet management and weight loss, by adopting a carbohydrate restricted/protein rich diet.  -  Suggestion is made for her to avoid simple carbohydrates  from her diet including Cakes, Sweet Desserts / Pastries, Ice Cream, Soda (diet and regular), Sweet Tea, Candies, Chips, Cookies, Store Bought Juices, Alcohol in Excess of  1-2 drinks a day, Artificial Sweeteners, and "Sugar-free" Products. This will help patient to have stable blood glucose profile and potentially avoid unintended weight gain.  - I encouraged her to switch to  unprocessed or minimally processed complex starch and increased protein intake (animal or plant source), fruits, and vegetables.  - she is advised to stick to a routine mealtimes to eat 3 meals  a day and avoid unnecessary snacks ( to snack only to correct hypoglycemia).   - I have approached her with the following individualized plan to manage diabetes and patient agrees:   -  Based on her glycemic profile, she will not require insulin treatment for now.  - I will continue Januvia 50 mg by mouth daily, therapeutically suitable for patient . - I will continue  low-dose metformin, 500 mg by mouth twice a day.  - Patient specific target  A1c;  LDL, HDL, Triglycerides, and  Waist Circumference were discussed in detail.  2)  BP/HTN: Controlled. She is not on an ACE inhibitor nor ARB, will be considered on subsequent visit.  3) Lipids/HPL:   Uncontrolled with LDL 129.   She has a resolving transaminitis likely induced by dyslipidemia rather than medications, patient is approached for low-dose statin therapy and she accepts. - I will initiate atorvastatin 20 mg by mouth daily at bedtime.  -Side effects and precautions discussed with her.  4)  Weight/Diet: CDE Consult has been initiated , exercise, and detailed carbohydrates information provided.  5) Multinodular goiter: Ultrasound confirms 1.3 cm nodule on  the isthmus, no suspicious features. - She will not require fine-needle aspiration at this time.  6) Chronic Care/Health Maintenance:  -she  Is not  on ACEI/ARB and Statin medications,   is encouraged to continue to follow up with Ophthalmology, Dentist,  Podiatrist at least yearly or according to recommendations, and advised to Quit  Smoking. I have recommended yearly flu vaccine and pneumonia vaccination at least every 5 years; moderate intensity exercise for up to 150 minutes weekly; and  sleep for at least 7 hours a day.  - Time spent with the patient: 25 min, of which >50% was spent in reviewing her sugar logs , discussing her hypo- and hyper-glycemic episodes, reviewing her current and  previous labs and insulin doses and developing a plan to avoid hypo- and hyper-glycemia.    - I advised patient to maintain close follow up with Lianne Moris, PA-C for primary care needs.  Follow up plan: - Return in about 3 months (around 09/21/2017) for follow up with pre-visit labs.  Marquis Lunch, MD Phone: [phone]  Fax: [phone]   06/21/2017, 12:36 PM   This note was partially dictated with voice recognition software. Similar sounding words can be transcribed inadequately or may not  be corrected upon review.

## 2017-07-28 ENCOUNTER — Other Ambulatory Visit: Payer: Self-pay | Admitting: "Endocrinology

## 2017-08-26 ENCOUNTER — Other Ambulatory Visit: Payer: Self-pay | Admitting: "Endocrinology

## 2017-09-22 LAB — COMPLETE METABOLIC PANEL WITH GFR
AG Ratio: 1.1 (calc) (ref 1.0–2.5)
ALKALINE PHOSPHATASE (APISO): 151 U/L — AB (ref 33–115)
ALT: 68 U/L — ABNORMAL HIGH (ref 6–29)
AST: 86 U/L — ABNORMAL HIGH (ref 10–35)
Albumin: 3.9 g/dL (ref 3.6–5.1)
BUN / CREAT RATIO: 9 (calc) (ref 6–22)
BUN: 6 mg/dL — AB (ref 7–25)
CALCIUM: 9.2 mg/dL (ref 8.6–10.2)
CO2: 28 mmol/L (ref 20–32)
Chloride: 101 mmol/L (ref 98–110)
Creat: 0.64 mg/dL (ref 0.50–1.10)
GFR, EST AFRICAN AMERICAN: 123 mL/min/{1.73_m2} (ref >=60)
GFR, EST NON AFRICAN AMERICAN: 106 mL/min/{1.73_m2} (ref >=60)
GLOBULIN: 3.4 g/dL (ref 1.9–3.7)
GLUCOSE: 247 mg/dL — AB (ref 65–139)
Potassium: 4.2 mmol/L (ref 3.5–5.3)
SODIUM: 136 mmol/L (ref 135–146)
TOTAL PROTEIN: 7.3 g/dL (ref 6.1–8.1)
Total Bilirubin: 0.3 mg/dL (ref 0.2–1.2)

## 2017-09-22 LAB — HEMOGLOBIN A1C
HEMOGLOBIN A1C: 7.4 %{Hb} — AB (ref <5.7)
MEAN PLASMA GLUCOSE: 166 (calc)
eAG (mmol/L): 9.2 (calc)

## 2017-09-24 ENCOUNTER — Telehealth: Payer: Self-pay

## 2017-09-24 ENCOUNTER — Ambulatory Visit (INDEPENDENT_AMBULATORY_CARE_PROVIDER_SITE_OTHER): Payer: Medicaid Other | Admitting: "Endocrinology

## 2017-09-24 ENCOUNTER — Encounter: Payer: Self-pay | Admitting: "Endocrinology

## 2017-09-24 VITALS — BP 125/83 | HR 89 | Ht 61.0 in | Wt 183.0 lb

## 2017-09-24 DIAGNOSIS — Z6834 Body mass index (BMI) 34.0-34.9, adult: Secondary | ICD-10-CM

## 2017-09-24 DIAGNOSIS — E118 Type 2 diabetes mellitus with unspecified complications: Secondary | ICD-10-CM | POA: Diagnosis not present

## 2017-09-24 DIAGNOSIS — F17200 Nicotine dependence, unspecified, uncomplicated: Secondary | ICD-10-CM

## 2017-09-24 DIAGNOSIS — E782 Mixed hyperlipidemia: Secondary | ICD-10-CM

## 2017-09-24 DIAGNOSIS — E6609 Other obesity due to excess calories: Secondary | ICD-10-CM

## 2017-09-24 DIAGNOSIS — IMO0002 Reserved for concepts with insufficient information to code with codable children: Secondary | ICD-10-CM

## 2017-09-24 DIAGNOSIS — E1165 Type 2 diabetes mellitus with hyperglycemia: Secondary | ICD-10-CM

## 2017-09-24 MED ORDER — METFORMIN HCL 1000 MG PO TABS: 1000.0000 mg | ORAL_TABLET | Freq: Two times a day (BID) | ORAL | 2 refills | Status: DC

## 2017-09-24 NOTE — Progress Notes (Signed)
Subjective:    Patient ID: [name] [name], female    DOB: Sep 25, 1970.  she is being seen in Follow-up for management of currently uncontrolled symptomatic diabetes requested by  Lianne Moris, PA-C.   Past Medical History:  Diagnosis Date  . Anemia   . Anxiety   . Asthma   . COPD (chronic obstructive pulmonary disease) (HCC)   . Depression   . Fatty liver   . GERD (gastroesophageal reflux disease)   . Hx of gonorrhea   . Shortness of breath    Past Surgical History:  Procedure Laterality Date  . cesarean section x 3    . CHOLECYSTECTOMY    . DILITATION & CURRETTAGE/HYSTROSCOPY WITH THERMACHOICE ABLATION N/A 02/28/2013   Procedure: DILATATION & CURETTAGE/HYSTEROSCOPY WITH THERMACHOICE ABLATION;  Surgeon: Tilda Burrow, MD;  Location: [facility];  Service: Gynecology;  Laterality: N/A;  Total Ablation Therapy Time = 9 minutes 31 seconds  . EUS  04/06/2012   Procedure: ESOPHAGEAL ENDOSCOPIC ULTRASOUND (EUS) RADIAL;  Surgeon: Willis Modena, MD;  Location: [facility];  Service: Endoscopy;  Laterality: N/A;  . TONSILLECTOMY    . TUBAL LIGATION     Social History   Socioeconomic History  . Marital status: Single    Spouse name: None  . Number of children: None  . Years of education: None  . Highest education level: None  Social Needs  . Financial resource strain: None  . Food insecurity - worry: None  . Food insecurity - inability: None  . Transportation needs - medical: None  . Transportation needs - non-medical: None  Occupational History  . None  Tobacco Use  . Smoking status: Current Every Day Smoker    Packs/day: 2.00    Years: 35.00    Pack years: 70.00    Types: Cigarettes  . Smokeless tobacco: Never Used  . Tobacco comment: never done snuff or chewing tobacco.  Substance and Sexual Activity  . Alcohol use: No  . Drug use: No  . Sexual activity: Not Currently  Other Topics Concern  . None  Social History Narrative  . None   Outpatient Encounter  Medications as of 09/24/2017  Medication Sig  . ALPRAZolam (XANAX) 1 MG tablet Take 1 mg by mouth at bedtime as needed for anxiety or sleep.  Marland Kitchen atorvastatin (LIPITOR) 20 MG tablet Take 1 tablet (20 mg total) daily by mouth.  . busPIRone (BUSPAR) 15 MG tablet Take 30 mg by mouth 3 (three) times daily.   . Cholecalciferol (VITAMIN D3) 5000 units CAPS TAKE 1 CAPSULE BY MOUTH ONCE DAILY.  Marland Kitchen esomeprazole (NEXIUM) 40 MG capsule Take 40 mg by mouth daily before breakfast.  . flunisolide (AEROBID) 250 MCG/ACT inhaler Inhale 2 puffs into the lungs 2 (two) times daily.  Marland Kitchen ibuprofen (ADVIL,MOTRIN) 800 MG tablet Take 800 mg by mouth every 8 (eight) hours as needed for moderate pain.  . metFORMIN (GLUCOPHAGE) 1000 MG tablet Take 1 tablet (1,000 mg total) by mouth 2 (two) times daily with a meal.  . Naproxen Sodium (ALEVE PO) Take 1 tablet by mouth every 8 (eight) hours as needed (pain).   Marland Kitchen sitaGLIPtin (JANUVIA) 50 MG tablet Take 50 mg by mouth daily.  . Vilazodone HCl (VIIBRYD) 40 MG TABS Take 40 mg by mouth daily.  . [DISCONTINUED] metFORMIN (GLUCOPHAGE) 500 MG tablet TAKE 1 TABLET TWICE DAILY WITH A MEAL   No facility-administered encounter medications on file as of 09/24/2017.     ALLERGIES: Allergies  Allergen Reactions  .  Codeine Nausea And Vomiting  . Sulfa Antibiotics Nausea And Vomiting    VACCINATION STATUS:  There is no immunization history on file for this patient.  Diabetes  She presents for her follow-up diabetic visit. She has type 2 diabetes mellitus. Onset time: She was diagnosed at approximate age of 47 years. Her disease course has been worsening. There are no hypoglycemic associated symptoms. Pertinent negatives for hypoglycemia include no confusion, headaches, pallor or seizures. Associated symptoms include fatigue. Pertinent negatives for diabetes include no blurred vision, no chest pain, no polydipsia, no polyphagia and no polyuria. There are no hypoglycemic complications.  Symptoms are worsening. There are no diabetic complications. Risk factors for coronary artery disease include dyslipidemia, diabetes mellitus, obesity, sedentary lifestyle and tobacco exposure. Current diabetic treatment includes oral agent (dual therapy). Her weight is increasing steadily. She is following a generally unhealthy diet. When asked about meal planning, she reported none. She has had a previous visit with a dietitian. She never participates in exercise. An ACE inhibitor/angiotensin II receptor blocker is not being taken. She sees a podiatrist.Eye exam is not current.  Hyperlipidemia  This is a chronic problem. The current episode started more than 1 year ago. The problem is uncontrolled. Recent lipid tests were reviewed and are high (Her LDL from December was 127). Exacerbating diseases include diabetes and obesity. Pertinent negatives include no chest pain, myalgias or shortness of breath. She is currently on no antihyperlipidemic treatment. Compliance problems include medication cost and psychosocial issues.  Risk factors for coronary artery disease include a sedentary lifestyle, obesity, dyslipidemia and diabetes mellitus.    Review of Systems  Constitutional: Positive for fatigue. Negative for chills, fever and unexpected weight change.  HENT: Negative for trouble swallowing and voice change.   Eyes: Negative for blurred vision and visual disturbance.  Respiratory: Negative for cough, shortness of breath and wheezing.   Cardiovascular: Negative for chest pain, palpitations and leg swelling.  Gastrointestinal: Negative for diarrhea, nausea and vomiting.  Endocrine: Negative for cold intolerance, heat intolerance, polydipsia, polyphagia and polyuria.  Musculoskeletal: Negative for arthralgias and myalgias.  Skin: Negative for color change, pallor, rash and wound.  Neurological: Negative for seizures and headaches.  Psychiatric/Behavioral: Negative for confusion and suicidal ideas.     Objective:    BP 125/83   Pulse 89   Ht 5\' 1"  (1.549 m)   Wt 183 lb (83 kg)   BMI 34.58 kg/m   Wt Readings from Last 3 Encounters:  09/24/17 183 lb (83 kg)  06/21/17 180 lb (81.6 kg)  06/21/17 180 lb (81.6 kg)     Physical Exam  Constitutional: She is oriented to person, place, and time. She appears well-developed.  HENT:  Head: Normocephalic and atraumatic.  Eyes: EOM are normal.  Neck: Normal range of motion. Neck supple. No tracheal deviation present. No thyromegaly present.  Cardiovascular: Normal rate and regular rhythm.  Pulmonary/Chest: Effort normal and breath sounds normal.  Abdominal: Soft. Bowel sounds are normal. There is no tenderness. There is no guarding.  Musculoskeletal: Normal range of motion. She exhibits no edema.  Neurological: She is alert and oriented to person, place, and time. She has normal reflexes. No cranial nerve deficit. Coordination normal.  Skin: Skin is warm and dry. No rash noted. No erythema. No pallor.  Extensive tattoos.  Psychiatric: She has a normal mood and affect. Judgment normal.   CMP ( most recent) CMP     Component Value Date/Time   NA 136 09/21/2017 0933  K 4.2 09/21/2017 0933   CL 101 09/21/2017 0933   CO2 28 09/21/2017 0933   GLUCOSE 247 (H) 09/21/2017 0933   BUN 6 (L) 09/21/2017 0933   CREATININE 0.64 09/21/2017 0933   CALCIUM 9.2 09/21/2017 0933   PROT 7.3 09/21/2017 0933   ALBUMIN 3.6 03/31/2017 1423   AST 86 (H) 09/21/2017 0933   ALT 68 (H) 09/21/2017 0933   ALKPHOS 140 (H) 03/31/2017 1423   BILITOT 0.3 09/21/2017 0933   GFRNONAA 106 09/21/2017 0933   GFRAA 123 09/21/2017 0933    Diabetic Labs (most recent): Lab Results  Component Value Date   HGBA1C 7.4 (H) 09/21/2017   HGBA1C 6.5 (H) 06/14/2017   HGBA1C 10.2 03/05/2017     Lipid Panel ( most recent) Lipid Panel     Component Value Date/Time   CHOL 187 06/14/2017 0826   TRIG 175 (H) 06/14/2017 0826   HDL 27 (L) 06/14/2017 0826   CHOLHDL 6.9  (H) 06/14/2017 0826   LDLCALC 127 08/07/2016      Assessment & Plan:   1. Uncontrolled type 2 diabetes mellitus with complication, without long-term current use of insulin (HCC)  - Patient has currently uncontrolled symptomatic type 2 DM since  47 years of age. - She came with worsening A1c of 7.4% from 6.5% .  She admits to dietary indiscretion. Recent labs reviewed, showing significant dyslipidemia.  - She does not report gross complications from diabetes, however, given her history of heavy smoking, Shaquala S Yawn remains at a high risk for more acute and chronic complications which include CAD, CVA, CKD, retinopathy, and neuropathy. These are all discussed in detail with the patient.  - I have counseled her on diet management and weight loss, by adopting a carbohydrate restricted/protein rich diet.  -  Suggestion is made for her to avoid simple carbohydrates  from her diet including Cakes, Sweet Desserts / Pastries, Ice Cream, Soda (diet and regular), Sweet Tea, Candies, Chips, Cookies, Store Bought Juices, Alcohol in Excess of  1-2 drinks a day, Artificial Sweeteners, and "Sugar-free" Products. This will help patient to have stable blood glucose profile and potentially avoid unintended weight gain.   - I encouraged her to switch to  unprocessed or minimally processed complex starch and increased protein intake (animal or plant source), fruits, and vegetables.  - she is advised to stick to a routine mealtimes to eat 3 meals  a day and avoid unnecessary snacks ( to snack only to correct hypoglycemia).   - I have approached her with the following individualized plan to manage diabetes and patient agrees:   -Given the fact that her A1c is increasing to 7.4% from 6.5%, she would require more treatment. - I will continue Januvia 50 mg by mouth daily, therapeutically suitable for patient . -She would benefit from maximum dose of metformin, I discussed an increase metformin to 1000 mg p.o.  twice daily with meals.    - Patient specific target  A1c;  LDL, HDL, Triglycerides, and  Waist Circumference were discussed in detail.  2) BP/HTN: Her blood pressure is controlled to target.   She is not on an ACE inhibitor nor ARB, will be considered on subsequent visit.  3) Lipids/HPL: Her lipid panel is uncontrolled with LDL of 129.  Her transaminases are reversing higher to AST of 86 and ALT of 68.   -Her atorvastatin will be discontinued for now.    She will have fasting lipid panel on subsequent visits.   4)  Weight/Diet: CDE Consult has been initiated , exercise, and detailed carbohydrates information provided.  5) Multinodular goiter: Ultrasound confirms 1.3 cm nodule on the isthmus, no suspicious features. - She will not require fine-needle aspiration at this time.  6) Chronic Care/Health Maintenance:  -she  Is not  on ACEI/ARB and Statin medications,   is encouraged to continue to follow up with Ophthalmology, Dentist,  Podiatrist at least yearly or according to recommendations, and advised to Quit  Smoking. I have recommended yearly flu vaccine and pneumonia vaccination at least every 5 years; moderate intensity exercise for up to 150 minutes weekly; and  sleep for at least 7 hours a day.   - I advised patient to maintain close follow up with Lianne Moris, PA-C for primary care needs.  - Time spent with the patient: 25 min, of which >50% was spent in reviewing her  current and  previous labs, previous treatments, and medications  doses and developing a plan for long-term care.   Follow up plan: - Return in about 3 months (around 12/22/2017) for follow up with pre-visit labs.  Marquis Lunch, MD Phone: [phone]  Fax: [phone]   09/24/2017, 11:27 AM   This note was partially dictated with voice recognition software. Similar sounding words can be transcribed inadequately or may not  be corrected upon review.

## 2017-09-24 NOTE — Telephone Encounter (Signed)
-----   Message from Roma KayserGebreselassie W Nida, MD sent at 09/24/2017 11:32 AM EST ----- Selena BattenKim, Her liver enzymes are rising again, I want her to stop atorvastatin until repeat blood work.

## 2017-09-24 NOTE — Patient Instructions (Signed)

## 2017-09-24 NOTE — Telephone Encounter (Signed)
Left message for pt to call back  °

## 2017-09-27 NOTE — Telephone Encounter (Signed)
Pt.notified

## 2017-10-05 ENCOUNTER — Other Ambulatory Visit (HOSPITAL_COMMUNITY): Payer: Self-pay | Admitting: *Deleted

## 2017-10-05 DIAGNOSIS — D72829 Elevated white blood cell count, unspecified: Secondary | ICD-10-CM

## 2017-10-06 ENCOUNTER — Ambulatory Visit (HOSPITAL_COMMUNITY): Payer: Medicaid Other | Admitting: Adult Health

## 2017-10-06 ENCOUNTER — Other Ambulatory Visit (HOSPITAL_COMMUNITY): Payer: Medicaid Other

## 2017-10-11 ENCOUNTER — Other Ambulatory Visit: Payer: Self-pay | Admitting: "Endocrinology

## 2017-10-21 ENCOUNTER — Inpatient Hospital Stay (HOSPITAL_BASED_OUTPATIENT_CLINIC_OR_DEPARTMENT_OTHER): Payer: Medicaid Other | Admitting: Adult Health

## 2017-10-21 ENCOUNTER — Encounter (HOSPITAL_COMMUNITY): Payer: Self-pay | Admitting: Adult Health

## 2017-10-21 ENCOUNTER — Other Ambulatory Visit: Payer: Self-pay

## 2017-10-21 ENCOUNTER — Inpatient Hospital Stay (HOSPITAL_COMMUNITY): Payer: Medicaid Other | Attending: Internal Medicine

## 2017-10-21 VITALS — BP 109/68 | HR 84 | Temp 97.8°F | Resp 16 | Wt 178.9 lb

## 2017-10-21 DIAGNOSIS — F17210 Nicotine dependence, cigarettes, uncomplicated: Secondary | ICD-10-CM | POA: Diagnosis not present

## 2017-10-21 DIAGNOSIS — F419 Anxiety disorder, unspecified: Secondary | ICD-10-CM | POA: Diagnosis not present

## 2017-10-21 DIAGNOSIS — Z79899 Other long term (current) drug therapy: Secondary | ICD-10-CM | POA: Insufficient documentation

## 2017-10-21 DIAGNOSIS — K219 Gastro-esophageal reflux disease without esophagitis: Secondary | ICD-10-CM | POA: Diagnosis not present

## 2017-10-21 DIAGNOSIS — R740 Nonspecific elevation of levels of transaminase and lactic acid dehydrogenase [LDH]: Secondary | ICD-10-CM

## 2017-10-21 DIAGNOSIS — F329 Major depressive disorder, single episode, unspecified: Secondary | ICD-10-CM | POA: Insufficient documentation

## 2017-10-21 DIAGNOSIS — D72829 Elevated white blood cell count, unspecified: Secondary | ICD-10-CM

## 2017-10-21 DIAGNOSIS — J449 Chronic obstructive pulmonary disease, unspecified: Secondary | ICD-10-CM | POA: Insufficient documentation

## 2017-10-21 DIAGNOSIS — F17200 Nicotine dependence, unspecified, uncomplicated: Secondary | ICD-10-CM

## 2017-10-21 LAB — CBC WITH DIFFERENTIAL/PLATELET
BASOS PCT: 0 %
Basophils Absolute: 0 10*3/uL (ref 0.0–0.1)
Eosinophils Absolute: 0.2 10*3/uL (ref 0.0–0.7)
Eosinophils Relative: 1 %
HEMATOCRIT: 42.9 % (ref 36.0–46.0)
HEMOGLOBIN: 13.9 g/dL (ref 12.0–15.0)
LYMPHS ABS: 5.8 10*3/uL — AB (ref 0.7–4.0)
Lymphocytes Relative: 37 %
MCH: 29.4 pg (ref 26.0–34.0)
MCHC: 32.4 g/dL (ref 30.0–36.0)
MCV: 90.7 fL (ref 78.0–100.0)
MONOS PCT: 5 %
Monocytes Absolute: 0.8 10*3/uL (ref 0.1–1.0)
NEUTROS ABS: 8.8 10*3/uL — AB (ref 1.7–7.7)
NEUTROS PCT: 57 %
Platelets: 268 10*3/uL (ref 150–400)
RBC: 4.73 MIL/uL (ref 3.87–5.11)
RDW: 15.5 % (ref 11.5–15.5)
WBC: 15.6 10*3/uL — AB (ref 4.0–10.5)

## 2017-10-21 LAB — COMPREHENSIVE METABOLIC PANEL
ALBUMIN: 3.8 g/dL (ref 3.5–5.0)
ALK PHOS: 139 U/L — AB (ref 38–126)
ALT: 82 U/L — AB (ref 14–54)
ANION GAP: 11 (ref 5–15)
AST: 53 U/L — ABNORMAL HIGH (ref 15–41)
BILIRUBIN TOTAL: 0.8 mg/dL (ref 0.3–1.2)
BUN: 9 mg/dL (ref 6–20)
CALCIUM: 9.4 mg/dL (ref 8.9–10.3)
CO2: 25 mmol/L (ref 22–32)
CREATININE: 0.6 mg/dL (ref 0.44–1.00)
Chloride: 101 mmol/L (ref 101–111)
GFR calc Af Amer: >60 mL/min (ref >60)
GFR calc non Af Amer: >60 mL/min (ref >60)
GLUCOSE: 103 mg/dL — AB (ref 65–99)
Potassium: 3.9 mmol/L (ref 3.5–5.1)
Sodium: 137 mmol/L (ref 135–145)
TOTAL PROTEIN: 7.4 g/dL (ref 6.5–8.1)

## 2017-10-21 MED ORDER — VARENICLINE TARTRATE 0.5 MG X 11 & 1 MG X 42 PO MISC: ORAL | 0 refills | Status: DC

## 2017-10-21 NOTE — Progress Notes (Signed)
[address], [place] [address]   CLINIC:  Medical Oncology/Hematology  PCP:  Lanelle Bal, PA-C [place] [id number] [phone]   REASON FOR VISIT:  Follow-up for Leukocytosis   CURRENT THERAPY: Observation     HISTORY OF PRESENT ILLNESS:  (From Dr. Laverle Patter note on 03/31/17)       INTERVAL HISTORY:  [name] [name] 47 y.o. female returns for routine follow-up for leukocytosis.   Here today unaccompanied.   Overall, she tells me she has been feeling "pretty good." Appetite and energy levels both 75%.  Denies any recent fevers or infections.    Continues to smoke cigarettes; smokes ~1.5-2 packs per day.  She tells me, "I know I need to quit. But, I don't really want to. I love my cigarettes so much."       REVIEW OF SYSTEMS:  Review of Systems  Constitutional: Negative.  Negative for chills, fatigue and fever.  HENT:  Negative.  Negative for lump/mass and nosebleeds.   Eyes: Negative.   Respiratory: Negative.   Cardiovascular: Negative.  Negative for chest pain and leg swelling.  Gastrointestinal: Negative.  Negative for abdominal pain, blood in stool, constipation, diarrhea, nausea and vomiting.  Endocrine: Negative.   Genitourinary: Negative.  Negative for dysuria and hematuria.   Musculoskeletal: Negative.  Negative for arthralgias.  Skin: Negative.  Negative for rash.  Neurological: Negative.  Negative for dizziness and headaches.  Hematological: Negative.  Negative for adenopathy. Does not bruise/bleed easily.  Psychiatric/Behavioral: Negative.  Negative for depression and sleep disturbance. The patient is not nervous/anxious.      PAST MEDICAL/SURGICAL HISTORY:  Past Medical History:  Diagnosis Date  . Anemia   . Anxiety   . Asthma   . COPD (chronic obstructive pulmonary disease) ([place])   . Depression   . Fatty liver   . GERD (gastroesophageal reflux disease)   . Hx of gonorrhea   . Shortness of breath     Past Surgical History:  Procedure Laterality Date  . cesarean section x 3    . CHOLECYSTECTOMY    . DILITATION & CURRETTAGE/HYSTROSCOPY WITH THERMACHOICE ABLATION N/A 02/28/2013   Procedure: DILATATION & CURETTAGE/HYSTEROSCOPY WITH THERMACHOICE ABLATION;  Surgeon: Jonnie Kind, MD;  Location: [facility];  Service: Gynecology;  Laterality: N/A;  Total Ablation Therapy Time = 9 minutes 31 seconds  . EUS  04/06/2012   Procedure: ESOPHAGEAL ENDOSCOPIC ULTRASOUND (EUS) RADIAL;  Surgeon: Arta Silence, MD;  Location: [facility];  Service: Endoscopy;  Laterality: N/A;  . TONSILLECTOMY    . TUBAL LIGATION       SOCIAL HISTORY:  Social History   Socioeconomic History  . Marital status: Single    Spouse name: Not on file  . Number of children: Not on file  . Years of education: Not on file  . Highest education level: Not on file  Social Needs  . Financial resource strain: Not on file  . Food insecurity - worry: Not on file  . Food insecurity - inability: Not on file  . Transportation needs - medical: Not on file  . Transportation needs - non-medical: Not on file  Occupational History  . Not on file  Tobacco Use  . Smoking status: Current Every Day Smoker    Packs/day: 2.00    Years: 35.00    Pack years: 70.00    Types: Cigarettes  . Smokeless tobacco: Never Used  . Tobacco comment: never done snuff or chewing  tobacco.  Substance and Sexual Activity  . Alcohol use: No  . Drug use: No  . Sexual activity: Not Currently  Other Topics Concern  . Not on file  Social History Narrative  . Not on file    FAMILY HISTORY:  Family History  Problem Relation Age of Onset  . Cancer Father        lung  . Spina bifida Brother     CURRENT MEDICATIONS:  Outpatient Encounter Medications as of 10/21/2017  Medication Sig  . ALPRAZolam (XANAX) 1 MG tablet Take 1 mg by mouth at bedtime as needed for anxiety or sleep.  Marland Kitchen atorvastatin (LIPITOR) 20 MG tablet Take 1 tablet (20 mg total)  daily by mouth.  . busPIRone (BUSPAR) 15 MG tablet Take 30 mg by mouth 3 (three) times daily.   . Cholecalciferol (VITAMIN D3) 5000 units CAPS TAKE 1 CAPSULE BY MOUTH ONCE DAILY.  Marland Kitchen Cholecalciferol (VITAMIN D3) 5000 units CAPS TAKE 1 CAPSULE BY MOUTH ONCE DAILY.  Marland Kitchen esomeprazole (NEXIUM) 40 MG capsule Take 40 mg by mouth daily before breakfast.  . flunisolide (AEROBID) 250 MCG/ACT inhaler Inhale 2 puffs into the lungs 2 (two) times daily.  Marland Kitchen ibuprofen (ADVIL,MOTRIN) 800 MG tablet Take 800 mg by mouth every 8 (eight) hours as needed for moderate pain.  . metFORMIN (GLUCOPHAGE) 1000 MG tablet Take 1 tablet (1,000 mg total) by mouth 2 (two) times daily with a meal.  . Naproxen Sodium (ALEVE PO) Take 1 tablet by mouth every 8 (eight) hours as needed (pain).   Marland Kitchen sitaGLIPtin (JANUVIA) 50 MG tablet Take 50 mg by mouth daily.  . Vilazodone HCl (VIIBRYD) 40 MG TABS Take 40 mg by mouth daily.  . varenicline (CHANTIX STARTING MONTH PAK) 0.5 MG X 11 & 1 MG X 42 tablet Take one 0.5 mg tablet by mouth once daily for 3 days, then increase to one 0.5 mg tablet twice daily for 4 days, then increase to one 1 mg tablet twice daily.   No facility-administered encounter medications on file as of 10/21/2017.     ALLERGIES:  Allergies  Allergen Reactions  . Codeine Nausea And Vomiting  . Sulfa Antibiotics Nausea And Vomiting     PHYSICAL EXAM:  ECOG Performance status: 0 - Asymptomatic   Vitals:   10/21/17 0936  BP: 109/68  Pulse: 84  Resp: 16  Temp: 97.8 F (36.6 C)  SpO2: 98%   Filed Weights   10/21/17 0936  Weight: 178 lb 14.4 oz (81.1 kg)    Physical Exam  Constitutional: She is oriented to person, place, and time and well-developed, well-nourished, and in no distress.  HENT:  Head: Normocephalic.  Mouth/Throat: Oropharynx is clear and moist. No oropharyngeal exudate.  Eyes: Conjunctivae are normal. Pupils are equal, round, and reactive to light. No scleral icterus.  Neck: Normal range of  motion. Neck supple.  Cardiovascular: Normal rate and regular rhythm.  Pulmonary/Chest: Effort normal and breath sounds normal. No respiratory distress.  Abdominal: Soft. Bowel sounds are normal. There is no tenderness.  Musculoskeletal: Normal range of motion. She exhibits no edema.  Lymphadenopathy:    She has no cervical adenopathy.       Right: No supraclavicular adenopathy present.       Left: No supraclavicular adenopathy present.  Neurological: She is alert and oriented to person, place, and time. No cranial nerve deficit. Gait normal.  Skin: Skin is warm and dry. No rash noted.  Psychiatric: Mood, memory, affect and judgment normal.  Nursing note and vitals reviewed.    LABORATORY DATA:  I have reviewed the labs as listed.  CBC    Component Value Date/Time   WBC 15.6 (H) 10/21/2017 0834   RBC 4.73 10/21/2017 0834   HGB 13.9 10/21/2017 0834   HCT 42.9 10/21/2017 0834   PLT 268 10/21/2017 0834   MCV 90.7 10/21/2017 0834   MCH 29.4 10/21/2017 0834   MCHC 32.4 10/21/2017 0834   RDW 15.5 10/21/2017 0834   LYMPHSABS 5.8 (H) 10/21/2017 0834   MONOABS 0.8 10/21/2017 0834   EOSABS 0.2 10/21/2017 0834   BASOSABS 0.0 10/21/2017 0834   CMP Latest Ref Rng & Units 10/21/2017 09/21/2017 06/14/2017  Glucose 65 - 99 mg/dL 103(H) 247(H) 103(H)  BUN 6 - 20 mg/dL 9 6(L) 5(L)  Creatinine 0.44 - 1.00 mg/dL 0.60 0.64 0.67  Sodium 135 - 145 mmol/L 137 136 140  Potassium 3.5 - 5.1 mmol/L 3.9 4.2 4.3  Chloride 101 - 111 mmol/L 101 101 104  CO2 22 - 32 mmol/L '25 28 30  '$ Calcium 8.9 - 10.3 mg/dL 9.4 9.2 9.0  Total Protein 6.5 - 8.1 g/dL 7.4 7.3 7.0  Total Bilirubin 0.3 - 1.2 mg/dL 0.8 0.3 0.3  Alkaline Phos 38 - 126 U/L 139(H) - -  AST 15 - 41 U/L 53(H) 86(H) 23  ALT 14 - 54 U/L 82(H) 68(H) 28    PENDING LABS:    DIAGNOSTIC IMAGING:    PATHOLOGY:     ASSESSMENT & PLAN:   Leukocytosis:  -Chronic leukocytosis thought to be secondary to chronic cigarette smoking.   Myeloproliferative work-up negative; BCR-ABL and JAK2 mutations both negative. Peripheral flow cytometry negative.  -WBCs improved mildly when she cut back some on her smoking. She has increased how much she smokes and WBCs are a bit higher than previous. Labs reviewed in detail with patient today. WBCs 15.6 (previously 14 in 03/2017).   Elevated AST/ALT/Alk Phos:  -May be secondary to fatty liver disease.   Tobacco use disorder:  -We discussed the pathophysiology of nicotine dependence and different strategies to stop smoking. The gold standard of tobacco cessation is nicotine replacement (with nicotine patches & gum/lozenges) or Varenicline (Chantix).  We discussed that the typical craving/urge to smoke lasts about 3-5 minutes; her biggest trigger(s) to use cigarettes is stress and anxiety. She shares with me a lot of her stressors she is currently trying to manage with her children and family.  She tells me her PCP had previously prescribed Chantix for her, "but I never tried it because I was scared it would make me crazy."  Shared with her that the most common side effects include vivid dreams and occasionally night terrors.  The likelihood of patients experiencing psychosis or suicidal ideation on Chantix is extremely rare.  We discussed the pros/cons of each nicotine dependence treatment. She agrees to give Chantix a try. Paper prescription for Chantix starter pack provided to patient today.  [name] [name]  understands that data suggests that "cold [place]" is the least effective way to stop using tobacco products.  Having a clear "quit plan" is much more effective and requires a step-wise approach with continued support from a tobacco treatment specialist.  I encouraged her to reach out to me if she has further questions or interest in her continued cessation efforts.  Greater than 20 minutes was spent in smoking cessation counseling with this patient.          Dispo:  -Labs only in 3 months (CBC with  diff).   -Return to cancer center in 6 months for follow-up with labs. (CBC with diff, CMET)    All questions were answered to patient's stated satisfaction. Encouraged patient to call with any new concerns or questions before her next visit to the cancer center and we can certain see her sooner, if needed.    A total of 30 minutes was spent in face-to-face care of this patient, with greater than 50% of that time spent in counseling and care-coordination.    Orders placed this encounter:  Orders Placed This Encounter  Procedures  . CBC with Differential/Platelet  . Comprehensive metabolic panel      Mike Craze, NP [place] [phone]

## 2017-11-11 ENCOUNTER — Telehealth (HOSPITAL_COMMUNITY): Payer: Self-pay

## 2017-11-11 NOTE — Telephone Encounter (Signed)
Noted.   She can try nicotine patches with nicotine lozenges/gum instead of Chantix to help her quit/cut back on her smoking.    Lubertha BasqueGretchen Fidelia Cathers, NP [facility] [phone]

## 2017-11-11 NOTE — Telephone Encounter (Signed)
Patient notified and verbalized understanding. 

## 2017-11-11 NOTE — Telephone Encounter (Signed)
Called to check on patients progress with Chantix and see if she needed the continuation dose. Patient states she "had to stop that mess". She states the chantix made her "crazy" and she "broke out in hives" on her abdomen. She states she is not using anything to help her stop smoking and is just continuing to smoke.

## 2017-11-15 ENCOUNTER — Other Ambulatory Visit: Payer: Self-pay | Admitting: "Endocrinology

## 2017-12-10 LAB — COMPLETE METABOLIC PANEL WITH GFR
AG RATIO: 1.3 (calc) (ref 1.0–2.5)
ALKALINE PHOSPHATASE (APISO): 133 U/L — AB (ref 33–115)
ALT: 41 U/L — ABNORMAL HIGH (ref 6–29)
AST: 54 U/L — ABNORMAL HIGH (ref 10–35)
Albumin: 4 g/dL (ref 3.6–5.1)
BILIRUBIN TOTAL: 0.6 mg/dL (ref 0.2–1.2)
BUN / CREAT RATIO: 7 (calc) (ref 6–22)
BUN: 4 mg/dL — AB (ref 7–25)
CHLORIDE: 104 mmol/L (ref 98–110)
CO2: 29 mmol/L (ref 20–32)
Calcium: 9.4 mg/dL (ref 8.6–10.2)
Creat: 0.6 mg/dL (ref 0.50–1.10)
GFR, Est African American: 126 mL/min/{1.73_m2} (ref >=60)
GFR, Est Non African American: 109 mL/min/{1.73_m2} (ref >=60)
GLUCOSE: 117 mg/dL — AB (ref 65–99)
Globulin: 3 g/dL (calc) (ref 1.9–3.7)
POTASSIUM: 4.1 mmol/L (ref 3.5–5.3)
Sodium: 140 mmol/L (ref 135–146)
Total Protein: 7 g/dL (ref 6.1–8.1)

## 2017-12-11 LAB — HEMOGLOBIN A1C
HEMOGLOBIN A1C: 6.4 %{Hb} — AB (ref <5.7)
Mean Plasma Glucose: 137 (calc)
eAG (mmol/L): 7.6 (calc)

## 2017-12-17 ENCOUNTER — Encounter: Payer: Self-pay | Admitting: "Endocrinology

## 2017-12-17 ENCOUNTER — Ambulatory Visit (INDEPENDENT_AMBULATORY_CARE_PROVIDER_SITE_OTHER): Payer: Medicaid Other | Admitting: "Endocrinology

## 2017-12-17 VITALS — BP 113/81 | HR 92 | Ht 61.0 in | Wt 175.0 lb

## 2017-12-17 DIAGNOSIS — E1165 Type 2 diabetes mellitus with hyperglycemia: Secondary | ICD-10-CM | POA: Diagnosis not present

## 2017-12-17 DIAGNOSIS — E118 Type 2 diabetes mellitus with unspecified complications: Secondary | ICD-10-CM | POA: Diagnosis not present

## 2017-12-17 DIAGNOSIS — E782 Mixed hyperlipidemia: Secondary | ICD-10-CM | POA: Diagnosis not present

## 2017-12-17 DIAGNOSIS — IMO0002 Reserved for concepts with insufficient information to code with codable children: Secondary | ICD-10-CM

## 2017-12-17 NOTE — Progress Notes (Signed)
Subjective:    Patient ID: [name] [name], female    DOB: March 24, 1971.  she is being seen in Follow-up for management of currently uncontrolled symptomatic diabetes requested by  Lianne Moris, PA-C.   Past Medical History:  Diagnosis Date  . Anemia   . Anxiety   . Asthma   . COPD (chronic obstructive pulmonary disease) (HCC)   . Depression   . Fatty liver   . GERD (gastroesophageal reflux disease)   . Hx of gonorrhea   . Shortness of breath    Past Surgical History:  Procedure Laterality Date  . cesarean section x 3    . CHOLECYSTECTOMY    . DILITATION & CURRETTAGE/HYSTROSCOPY WITH THERMACHOICE ABLATION N/A 02/28/2013   Procedure: DILATATION & CURETTAGE/HYSTEROSCOPY WITH THERMACHOICE ABLATION;  Surgeon: Tilda Burrow, MD;  Location: [facility];  Service: Gynecology;  Laterality: N/A;  Total Ablation Therapy Time = 9 minutes 31 seconds  . EUS  04/06/2012   Procedure: ESOPHAGEAL ENDOSCOPIC ULTRASOUND (EUS) RADIAL;  Surgeon: Willis Modena, MD;  Location: [facility];  Service: Endoscopy;  Laterality: N/A;  . TONSILLECTOMY    . TUBAL LIGATION     Social History   Socioeconomic History  . Marital status: Single    Spouse name: Not on file  . Number of children: Not on file  . Years of education: Not on file  . Highest education level: Not on file  Occupational History  . Not on file  Social Needs  . Financial resource strain: Not on file  . Food insecurity:    Worry: Not on file    Inability: Not on file  . Transportation needs:    Medical: Not on file    Non-medical: Not on file  Tobacco Use  . Smoking status: Current Every Day Smoker    Packs/day: 2.00    Years: 35.00    Pack years: 70.00    Types: Cigarettes  . Smokeless tobacco: Never Used  . Tobacco comment: never done snuff or chewing tobacco.  Substance and Sexual Activity  . Alcohol use: No  . Drug use: No  . Sexual activity: Not Currently  Lifestyle  . Physical activity:    Days per week: Not on  file    Minutes per session: Not on file  . Stress: Not on file  Relationships  . Social connections:    Talks on phone: Not on file    Gets together: Not on file    Attends religious service: Not on file    Active member of club or organization: Not on file    Attends meetings of clubs or organizations: Not on file    Relationship status: Not on file  Other Topics Concern  . Not on file  Social History Narrative  . Not on file   Outpatient Encounter Medications as of 12/17/2017  Medication Sig  . ALPRAZolam (XANAX) 1 MG tablet Take 1 mg by mouth at bedtime as needed for anxiety or sleep.  Marland Kitchen atorvastatin (LIPITOR) 20 MG tablet Take 1 tablet (20 mg total) daily by mouth.  . busPIRone (BUSPAR) 15 MG tablet Take 30 mg by mouth 3 (three) times daily.   . Cholecalciferol (VITAMIN D3) 5000 units CAPS TAKE 1 CAPSULE BY MOUTH ONCE DAILY.  Marland Kitchen Cholecalciferol (VITAMIN D3) 5000 units CAPS TAKE 1 CAPSULE BY MOUTH ONCE DAILY.  Marland Kitchen esomeprazole (NEXIUM) 40 MG capsule Take 40 mg by mouth daily before breakfast.  . flunisolide (AEROBID) 250 MCG/ACT inhaler Inhale 2  puffs into the lungs 2 (two) times daily.  Marland Kitchen ibuprofen (ADVIL,MOTRIN) 800 MG tablet Take 800 mg by mouth every 8 (eight) hours as needed for moderate pain.  . metFORMIN (GLUCOPHAGE) 1000 MG tablet Take 1 tablet (1,000 mg total) by mouth 2 (two) times daily with a meal.  . Naproxen Sodium (ALEVE PO) Take 1 tablet by mouth every 8 (eight) hours as needed (pain).   Marland Kitchen sitaGLIPtin (JANUVIA) 50 MG tablet Take 50 mg by mouth daily.  . varenicline (CHANTIX STARTING MONTH PAK) 0.5 MG X 11 & 1 MG X 42 tablet Take one 0.5 mg tablet by mouth once daily for 3 days, then increase to one 0.5 mg tablet twice daily for 4 days, then increase to one 1 mg tablet twice daily.  . Vilazodone HCl (VIIBRYD) 40 MG TABS Take 40 mg by mouth daily.   No facility-administered encounter medications on file as of 12/17/2017.     ALLERGIES: Allergies  Allergen Reactions   . Codeine Nausea And Vomiting  . Sulfa Antibiotics Nausea And Vomiting    VACCINATION STATUS:  There is no immunization history on file for this patient.  Diabetes  She presents for her follow-up diabetic visit. She has type 2 diabetes mellitus. Onset time: She was diagnosed at approximate age of 47 years. Her disease course has been improving. There are no hypoglycemic associated symptoms. Pertinent negatives for hypoglycemia include no confusion, headaches, pallor or seizures. Associated symptoms include fatigue. Pertinent negatives for diabetes include no blurred vision, no chest pain, no polydipsia, no polyphagia and no polyuria. There are no hypoglycemic complications. Symptoms are improving. There are no diabetic complications. Risk factors for coronary artery disease include dyslipidemia, diabetes mellitus, obesity, sedentary lifestyle and tobacco exposure. Current diabetic treatment includes oral agent (dual therapy). Her weight is decreasing steadily. She is following a generally unhealthy diet. When asked about meal planning, she reported none. She has had a previous visit with a dietitian. She never participates in exercise. Her home blood glucose trend is decreasing steadily. An ACE inhibitor/angiotensin II receptor blocker is not being taken. She sees a podiatrist.Eye exam is not current.  Hyperlipidemia  This is a chronic problem. The current episode started more than 1 year ago. The problem is uncontrolled. Recent lipid tests were reviewed and are high (Her LDL from December was 127). Exacerbating diseases include diabetes and obesity. Pertinent negatives include no chest pain, myalgias or shortness of breath. She is currently on no antihyperlipidemic treatment. Compliance problems include medication cost and psychosocial issues.  Risk factors for coronary artery disease include a sedentary lifestyle, obesity, dyslipidemia and diabetes mellitus.    Review of Systems  Constitutional:  Positive for fatigue. Negative for chills, fever and unexpected weight change.  HENT: Negative for trouble swallowing and voice change.   Eyes: Negative for blurred vision and visual disturbance.  Respiratory: Negative for cough, shortness of breath and wheezing.   Cardiovascular: Negative for chest pain, palpitations and leg swelling.  Gastrointestinal: Negative for diarrhea, nausea and vomiting.  Endocrine: Negative for cold intolerance, heat intolerance, polydipsia, polyphagia and polyuria.  Musculoskeletal: Negative for arthralgias and myalgias.  Skin: Negative for color change, pallor, rash and wound.  Neurological: Negative for seizures and headaches.  Psychiatric/Behavioral: Negative for confusion and suicidal ideas.    Objective:    BP 113/81   Pulse 92   Ht  (1.549 m)   Wt 175 lb (79.4 kg)   BMI 33.07 kg/m   Wt Readings from Last 3  Encounters:  12/17/17 175 lb (79.4 kg)  10/21/17 178 lb 14.4 oz (81.1 kg)  09/24/17 183 lb (83 kg)     Physical Exam  Constitutional: She is oriented to person, place, and time. She appears well-developed.  HENT:  Head: Normocephalic and atraumatic.  Eyes: EOM are normal.  Neck: Normal range of motion. Neck supple. No tracheal deviation present. No thyromegaly present.  Cardiovascular: Normal rate and regular rhythm.  Pulmonary/Chest: Effort normal and breath sounds normal.  Abdominal: Soft. Bowel sounds are normal. There is no tenderness. There is no guarding.  Musculoskeletal: Normal range of motion. She exhibits no edema.  Neurological: She is alert and oriented to person, place, and time. She has normal reflexes. No cranial nerve deficit. Coordination normal.  Skin: Skin is warm and dry. No rash noted. No erythema. No pallor.  Extensive tattoos.  Psychiatric: She has a normal mood and affect. Judgment normal.   CMP ( most recent) CMP     Component Value Date/Time   NA 140 12/10/2017 0902   K 4.1 12/10/2017 0902   CL 104  12/10/2017 0902   CO2 29 12/10/2017 0902   GLUCOSE 117 (H) 12/10/2017 0902   BUN 4 (L) 12/10/2017 0902   CREATININE 0.60 12/10/2017 0902   CALCIUM 9.4 12/10/2017 0902   PROT 7.0 12/10/2017 0902   ALBUMIN 3.8 10/21/2017 0834   AST 54 (H) 12/10/2017 0902   ALT 41 (H) 12/10/2017 0902   ALKPHOS 139 (H) 10/21/2017 0834   BILITOT 0.6 12/10/2017 0902   GFRNONAA 109 12/10/2017 0902   GFRAA 126 12/10/2017 0902    Diabetic Labs (most recent): Lab Results  Component Value Date   HGBA1C 6.4 (H) 12/10/2017   HGBA1C 7.4 (H) 09/21/2017   HGBA1C 6.5 (H) 06/14/2017     Lipid Panel ( most recent) Lipid Panel     Component Value Date/Time   CHOL 187 06/14/2017 0826   TRIG 175 (H) 06/14/2017 0826   HDL 27 (L) 06/14/2017 0826   CHOLHDL 6.9 (H) 06/14/2017 0826   LDLCALC 129 (H) 06/14/2017 0826      Assessment & Plan:   1. Uncontrolled type 2 diabetes mellitus with complication, without long-term current use of insulin (HCC)  - Patient has currently uncontrolled symptomatic type 2 DM since  47 years of age. - She came with improving A1c of 6.4% from 7.4%.    -  She still  admits to dietary indiscretion. Recent labs reviewed, showing significant dyslipidemia.  - She does not report gross complications from diabetes, however, given her history of heavy smoking, Floyce S Katich remains at a high risk for more acute and chronic complications which include CAD, CVA, CKD, retinopathy, and neuropathy. These are all discussed in detail with the patient.  - I have counseled her on diet management and weight loss, by adopting a carbohydrate restricted/protein rich diet.  -  Suggestion is made for her to avoid simple carbohydrates  from her diet including Cakes, Sweet Desserts / Pastries, Ice Cream, Soda (diet and regular), Sweet Tea, Candies, Chips, Cookies, Store Bought Juices, Alcohol in Excess of  1-2 drinks a day, Artificial Sweeteners, and "Sugar-free" Products. This will help patient to have  stable blood glucose profile and potentially avoid unintended weight gain.  - I encouraged her to switch to  unprocessed or minimally processed complex starch and increased protein intake (animal or plant source), fruits, and vegetables.  - she is advised to stick to a routine mealtimes to eat 3 meals  a day and avoid unnecessary snacks ( to snack only to correct hypoglycemia).   - I have approached her with the following individualized plan to manage diabetes and patient agrees:   -Given her presentation with improved A1c of 6.4%, she will not require insulin treatment at this time.   -I discussed and continued metformin 1000 mg p.o. twice daily with meals along with Januvia 50 mg p.o. daily with breakfast.   - Patient specific target  A1c;  LDL, HDL, Triglycerides, and  Waist Circumference were discussed in detail.  2) BP/HTN: Her blood pressure is controlled to target.   She is not on an ACE inhibitor nor ARB, will be considered on subsequent visit.  3) Lipids/HPL: Her lipid panel is uncontrolled with LDL of 129.  Her transaminases are reversing higher to AST of 86 and ALT of 68.   -She is off of atorvastatin for now.     She will have fasting lipid panel on subsequent visits.   4)  Weight/Diet: CDE Consult has been initiated , exercise, and detailed carbohydrates information provided.  5) Multinodular goiter: Ultrasound confirms 1.3 cm nodule on the isthmus, no suspicious features. - She will not require fine-needle aspiration at this time.  6) Chronic Care/Health Maintenance:  -she  Is not  on ACEI/ARB and Statin medications,   is encouraged to continue to follow up with Ophthalmology, Dentist,  Podiatrist at least yearly or according to recommendations, and advised to Quit  Smoking. I have recommended yearly flu vaccine and pneumonia vaccination at least every 5 years; moderate intensity exercise for up to 150 minutes weekly; and  sleep for at least 7 hours a day.   - I advised  patient to maintain close follow up with Lianne Moris, PA-C for primary care needs.  - Time spent with the patient: 25 min, of which >50% was spent in reviewing her  current and  previous labs, previous treatments, and medications doses and developing a plan for long-term care.  Ruqayya S Lambertson participated in the discussions, expressed understanding, and voiced agreement with the above plans.  All questions were answered to her satisfaction. she is encouraged to contact clinic should she have any questions or concerns prior to her return visit.  Follow up plan: - Return in about 4 months (around 04/19/2018) for follow up with pre-visit labs.  Marquis Lunch, MD Phone: [phone]  Fax: [phone]   12/17/2017, 9:54 AM   This note was partially dictated with voice recognition software. Similar sounding words can be transcribed inadequately or may not  be corrected upon review.

## 2017-12-17 NOTE — Patient Instructions (Signed)

## 2018-01-20 ENCOUNTER — Other Ambulatory Visit: Payer: Self-pay | Admitting: "Endocrinology

## 2018-01-21 ENCOUNTER — Other Ambulatory Visit (HOSPITAL_COMMUNITY): Payer: Medicaid Other

## 2018-04-20 ENCOUNTER — Ambulatory Visit: Payer: Medicaid Other | Admitting: "Endocrinology

## 2018-04-22 ENCOUNTER — Inpatient Hospital Stay (HOSPITAL_COMMUNITY): Payer: Medicaid Other | Attending: Hematology

## 2018-04-22 ENCOUNTER — Inpatient Hospital Stay (HOSPITAL_BASED_OUTPATIENT_CLINIC_OR_DEPARTMENT_OTHER): Payer: Medicaid Other | Admitting: Internal Medicine

## 2018-04-22 ENCOUNTER — Encounter (HOSPITAL_COMMUNITY): Payer: Self-pay | Admitting: Internal Medicine

## 2018-04-22 DIAGNOSIS — E1165 Type 2 diabetes mellitus with hyperglycemia: Secondary | ICD-10-CM | POA: Insufficient documentation

## 2018-04-22 DIAGNOSIS — N6489 Other specified disorders of breast: Secondary | ICD-10-CM | POA: Insufficient documentation

## 2018-04-22 DIAGNOSIS — F17210 Nicotine dependence, cigarettes, uncomplicated: Secondary | ICD-10-CM

## 2018-04-22 DIAGNOSIS — R059 Cough, unspecified: Secondary | ICD-10-CM

## 2018-04-22 DIAGNOSIS — K760 Fatty (change of) liver, not elsewhere classified: Secondary | ICD-10-CM | POA: Insufficient documentation

## 2018-04-22 DIAGNOSIS — Z720 Tobacco use: Secondary | ICD-10-CM

## 2018-04-22 DIAGNOSIS — D72828 Other elevated white blood cell count: Secondary | ICD-10-CM | POA: Diagnosis not present

## 2018-04-22 DIAGNOSIS — D72829 Elevated white blood cell count, unspecified: Secondary | ICD-10-CM

## 2018-04-22 DIAGNOSIS — F17200 Nicotine dependence, unspecified, uncomplicated: Secondary | ICD-10-CM

## 2018-04-22 DIAGNOSIS — R05 Cough: Secondary | ICD-10-CM

## 2018-04-22 LAB — CBC WITH DIFFERENTIAL/PLATELET
Basophils Absolute: 0.1 10*3/uL (ref 0.0–0.1)
Basophils Relative: 0 %
EOS ABS: 0.3 10*3/uL (ref 0.0–0.7)
Eosinophils Relative: 2 %
HCT: 40.1 % (ref 36.0–46.0)
HEMOGLOBIN: 13.1 g/dL (ref 12.0–15.0)
Lymphocytes Relative: 38 %
Lymphs Abs: 6 10*3/uL — ABNORMAL HIGH (ref 0.7–4.0)
MCH: 29.4 pg (ref 26.0–34.0)
MCHC: 32.7 g/dL (ref 30.0–36.0)
MCV: 89.9 fL (ref 78.0–100.0)
MONOS PCT: 5 %
Monocytes Absolute: 0.8 10*3/uL (ref 0.1–1.0)
NEUTROS PCT: 55 %
Neutro Abs: 8.9 10*3/uL — ABNORMAL HIGH (ref 1.7–7.7)
PLATELETS: 229 10*3/uL (ref 150–400)
RBC: 4.46 MIL/uL (ref 3.87–5.11)
RDW: 14.3 % (ref 11.5–15.5)
WBC: 16 10*3/uL — AB (ref 4.0–10.5)

## 2018-04-22 LAB — COMPREHENSIVE METABOLIC PANEL
ALK PHOS: 145 U/L — AB (ref 38–126)
ALT: 35 U/L (ref 0–44)
ANION GAP: 9 (ref 5–15)
AST: 26 U/L (ref 15–41)
Albumin: 3.4 g/dL — ABNORMAL LOW (ref 3.5–5.0)
BUN: 5 mg/dL — ABNORMAL LOW (ref 6–20)
CALCIUM: 8.9 mg/dL (ref 8.9–10.3)
CHLORIDE: 101 mmol/L (ref 98–111)
CO2: 24 mmol/L (ref 22–32)
Creatinine, Ser: 0.73 mg/dL (ref 0.44–1.00)
GFR calc Af Amer: >60 mL/min (ref >60)
GFR calc non Af Amer: >60 mL/min (ref >60)
GLUCOSE: 450 mg/dL — AB (ref 70–99)
Potassium: 4 mmol/L (ref 3.5–5.1)
SODIUM: 134 mmol/L — AB (ref 135–145)
Total Bilirubin: 0.5 mg/dL (ref 0.3–1.2)
Total Protein: 7.2 g/dL (ref 6.5–8.1)

## 2018-04-22 MED ORDER — SULFAMETHOXAZOLE-TRIMETHOPRIM 800-160 MG PO TABS: 1.0000 | ORAL_TABLET | Freq: Two times a day (BID) | ORAL | 0 refills | Status: AC

## 2018-04-22 NOTE — Progress Notes (Signed)
Diagnosis Cough - Plan: CT CHEST W CONTRAST, CBC with Differential/Platelet, Comprehensive metabolic panel, Lactate dehydrogenase, CBC with Differential/Platelet, Comprehensive metabolic panel, Lactate dehydrogenase  Tobacco use - Plan: CT CHEST W CONTRAST, CBC with Differential/Platelet, Comprehensive metabolic panel, Lactate dehydrogenase, CBC with Differential/Platelet, Comprehensive metabolic panel, Lactate dehydrogenase  Staging Cancer Staging No matching staging information was found for the patient.  Assessment and Plan:  1.  Leukocytosis.  Felt secondary to chronic cigarette smoking.  Myeloproliferative work-up negative; BCR-ABL and JAK2 mutations both negative. Peripheral flow cytometry negative.  -WBCs improved mildly when she cut back some on her smoking.   Labs done 04/22/2018 reviewed and showed WBC 16 HB 13 plts 229,000.  Chemistries WNL with K+ 4 Cr 0.73 and normal LFTs.    Pt has a left breast lesion and also has elevated Blood sugars.  I have discussed with her smoking, infection and uncontrolled blood sugars may play a role in WBC elevation.  WBC is stable with blood work done in 10/2017 when WBC was 16,000.  She will RTC in 07/2018 for repeat labs.  She will be seen for follow-up in 10/2018 with labs.    2.  Left breast infection.  Pt reports she has chronic boils that develop.  She has evidence of left breast erythema.  She is Rxd Bactrim DS po bid for 7 days.  Pt reports bactrim allergy related to yeast infection.  She is referred to surgery for evaluation. I have discussed with her Blood sugars are elevated at 450.   She should follow-up with PCP for management. Pt should undergo mammogram if not done once infection has improved.    3.  DM.  Blood sugar greatly elevated at 450.  Pt should follow-up with PCP for management.  I have discussed with her uncontrolled DM may also contribute to recurring infections.    4.  Fatty liver.  This was noted on MRI done 12/21/2012.  LFTs  on labs done 04/22/2018 WNL.    5.  Smoking. Cessation recommended.  PT has 30 pack year history of smoking.  She will be set up with CT chest for further evaluation due to recurring breast infections.    Greater than 30 minutes spent with more than 50% spent in counseling and coordination of care.    Current Status:  Pt is seen today for follow-up.  She is complaining of left breast pain.  She reports she was taking doxycycline but has noted no improvement in symptoms.  She is here to go over labs.    Problem List Patient Active Problem List   Diagnosis Date Noted  . Current smoker [F17.200] 05/06/2017  . Multinodular goiter [E04.2] 05/06/2017  . Uncontrolled type 2 diabetes mellitus with complication, without long-term current use of insulin ([facility]) [E11.8, E11.65] 04/22/2017  . Class 1 obesity due to excess calories with serious comorbidity and body mass index (BMI) of 34.0 to 34.9 in adult [E66.09, Z68.34] 04/22/2017  . Mixed hyperlipidemia [E78.2] 04/22/2017  . Leukocytosis [D72.829] 09/09/2016  . Depression [F32.9] 01/10/2013  . Reflux [K21.9] 01/10/2013    Past Medical History Past Medical History:  Diagnosis Date  . Anemia   . Anxiety   . Asthma   . COPD (chronic obstructive pulmonary disease) ([facility])   . Depression   . Fatty liver   . GERD (gastroesophageal reflux disease)   . Hx of gonorrhea   . Shortness of breath     Past Surgical History Past Surgical History:  Procedure Laterality Date  .  cesarean section x 3    . CHOLECYSTECTOMY    . DILITATION & CURRETTAGE/HYSTROSCOPY WITH THERMACHOICE ABLATION N/A 02/28/2013   Procedure: DILATATION & CURETTAGE/HYSTEROSCOPY WITH THERMACHOICE ABLATION;  Surgeon: Jonnie Kind, MD;  Location: [facility];  Service: Gynecology;  Laterality: N/A;  Total Ablation Therapy Time = 9 minutes 31 seconds  . EUS  04/06/2012   Procedure: ESOPHAGEAL ENDOSCOPIC ULTRASOUND (EUS) RADIAL;  Surgeon: Arta Silence, MD;  Location: [facility];  Service:  Endoscopy;  Laterality: N/A;  . TONSILLECTOMY    . TUBAL LIGATION      Family History Family History  Problem Relation Age of Onset  . Cancer Father        lung  . Spina bifida Brother      Social History  reports that she has been smoking cigarettes. She has a 70.00 pack-year smoking history. She has never used smokeless tobacco. She reports that she does not drink alcohol or use drugs.  Medications  Current Outpatient Medications:  .  ALPRAZolam (XANAX) 1 MG tablet, Take 1 mg by mouth at bedtime as needed for anxiety or sleep., Disp: , Rfl:  .  atorvastatin (LIPITOR) 20 MG tablet, Take 1 tablet (20 mg total) daily by mouth., Disp: 90 tablet, Rfl: 3 .  busPIRone (BUSPAR) 15 MG tablet, Take 30 mg by mouth 3 (three) times daily. , Disp: , Rfl:  .  Cholecalciferol (VITAMIN D3) 5000 units CAPS, TAKE 1 CAPSULE BY MOUTH ONCE DAILY., Disp: 30 capsule, Rfl: 2 .  Cholecalciferol (VITAMIN D3) 5000 units CAPS, TAKE 1 CAPSULE BY MOUTH ONCE DAILY., Disp: 30 capsule, Rfl: 2 .  esomeprazole (NEXIUM) 40 MG capsule, Take 40 mg by mouth daily before breakfast., Disp: , Rfl:  .  flunisolide (AEROBID) 250 MCG/ACT inhaler, Inhale 2 puffs into the lungs 2 (two) times daily., Disp: , Rfl:  .  ibuprofen (ADVIL,MOTRIN) 800 MG tablet, Take 800 mg by mouth every 8 (eight) hours as needed for moderate pain., Disp: , Rfl:  .  metFORMIN (GLUCOPHAGE) 1000 MG tablet, TAKE 1 TABLET BY MOUTH TWICE DAILY WITH FOOD., Disp: 60 tablet, Rfl: 2 .  Naproxen Sodium (ALEVE PO), Take 1 tablet by mouth every 8 (eight) hours as needed (pain). , Disp: , Rfl:  .  sitaGLIPtin (JANUVIA) 50 MG tablet, Take 50 mg by mouth daily., Disp: , Rfl:  .  varenicline (CHANTIX STARTING MONTH PAK) 0.5 MG X 11 & 1 MG X 42 tablet, Take one 0.5 mg tablet by mouth once daily for 3 days, then increase to one 0.5 mg tablet twice daily for 4 days, then increase to one 1 mg tablet twice daily., Disp: 53 tablet, Rfl: 0 .  Vilazodone HCl (VIIBRYD) 40 MG  TABS, Take 40 mg by mouth daily., Disp: , Rfl:  .  sulfamethoxazole-trimethoprim (BACTRIM DS,SEPTRA DS) 800-160 MG tablet, Take 1 tablet by mouth 2 (two) times daily for 7 days., Disp: 14 tablet, Rfl: 0  Allergies Codeine and Sulfa antibiotics  Review of Systems Review of Systems - Oncology ROS negative other than left breast pain and recurrent chest wall infections.     Physical Exam  Vitals Wt Readings from Last 3 Encounters:  04/22/18 178 lb 9.6 oz (81 kg)  12/17/17 175 lb (79.4 kg)  10/21/17 178 lb 14.4 oz (81.1 kg)   Temp Readings from Last 3 Encounters:  04/22/18 98.3 F (36.8 C) (Oral)  10/21/17 97.8 F (36.6 C) (Oral)  03/31/17 97.9 F (36.6 C) (Oral)   BP Readings  from Last 3 Encounters:  04/22/18 (!) 115/48  12/17/17 113/81  10/21/17 109/68   Pulse Readings from Last 3 Encounters:  04/22/18 83  12/17/17 92  10/21/17 84   Constitutional: Well-developed, well-nourished, and in no distress.   HENT: Head: Normocephalic and atraumatic.  Mouth/Throat: No oropharyngeal exudate. Mucosa moist. Eyes: Pupils are equal, round, and reactive to light. Conjunctivae are normal. No scleral icterus.  Neck: Normal range of motion. Neck supple. No JVD present.  Cardiovascular: Normal rate, regular rhythm and normal heart sounds.  Exam reveals no gallop and no friction rub.   No murmur heard. Pulmonary/Chest: Effort normal and breath sounds normal. No respiratory distress. No wheezes.No rales.  Abdominal: Soft. Bowel sounds are normal. No distension. There is no tenderness. There is no guarding.  Musculoskeletal: No edema or tenderness.  Lymphadenopathy: No cervical, axillary or supraclavicular adenopathy.  Neurological: Alert and oriented to person, place, and time. No cranial nerve deficit.  Skin: Skin is warm and dry. No rash noted. No erythema. No pallor.  Psychiatric: Affect and judgment normal.  Bilateral breast exam:  Chaperone present.  Left breast cellulitis noted.   No dominant masses palpable bilaterally.    Labs Appointment on 04/22/2018  Component Date Value Ref Range Status  . WBC 04/22/2018 16.0* 4.0 - 10.5 K/uL Final  . RBC 04/22/2018 4.46  3.87 - 5.11 MIL/uL Final  . Hemoglobin 04/22/2018 13.1  12.0 - 15.0 g/dL Final  . HCT 04/22/2018 40.1  36.0 - 46.0 % Final  . MCV 04/22/2018 89.9  78.0 - 100.0 fL Final  . MCH 04/22/2018 29.4  26.0 - 34.0 pg Final  . MCHC 04/22/2018 32.7  30.0 - 36.0 g/dL Final  . RDW 04/22/2018 14.3  11.5 - 15.5 % Final  . Platelets 04/22/2018 229  150 - 400 K/uL Final  . Neutrophils Relative % 04/22/2018 55  % Final  . Neutro Abs 04/22/2018 8.9* 1.7 - 7.7 K/uL Final  . Lymphocytes Relative 04/22/2018 38  % Final  . Lymphs Abs 04/22/2018 6.0* 0.7 - 4.0 K/uL Final  . Monocytes Relative 04/22/2018 5  % Final  . Monocytes Absolute 04/22/2018 0.8  0.1 - 1.0 K/uL Final  . Eosinophils Relative 04/22/2018 2  % Final  . Eosinophils Absolute 04/22/2018 0.3  0.0 - 0.7 K/uL Final  . Basophils Relative 04/22/2018 0  % Final  . Basophils Absolute 04/22/2018 0.1  0.0 - 0.1 K/uL Final   Performed at [facility], [address] [place], [place] [address]  . Sodium 04/22/2018 134* 135 - 145 mmol/L Final  . Potassium 04/22/2018 4.0  3.5 - 5.1 mmol/L Final  . Chloride 04/22/2018 101  98 - 111 mmol/L Final  . CO2 04/22/2018 24  22 - 32 mmol/L Final  . Glucose, Bld 04/22/2018 450* 70 - 99 mg/dL Final  . BUN 04/22/2018 5* 6 - 20 mg/dL Final  . Creatinine, Ser 04/22/2018 0.73  0.44 - 1.00 mg/dL Final  . Calcium 04/22/2018 8.9  8.9 - 10.3 mg/dL Final  . Total Protein 04/22/2018 7.2  6.5 - 8.1 g/dL Final  . Albumin 04/22/2018 3.4* 3.5 - 5.0 g/dL Final  . AST 04/22/2018 26  15 - 41 U/L Final  . ALT 04/22/2018 35  0 - 44 U/L Final  . Alkaline Phosphatase 04/22/2018 145* 38 - 126 U/L Final  . Total Bilirubin 04/22/2018 0.5  0.3 - 1.2 mg/dL Final  . GFR calc non Af Amer 04/22/2018 >60  >60 mL/min Final  . GFR  calc Af Amer 04/22/2018 >60   >60 mL/min Final   Comment: (NOTE) The eGFR has been calculated using the CKD EPI equation. This calculation has not been validated in all clinical situations. eGFR's persistently <60 mL/min signify possible Chronic Kidney Disease.   Georgiann Hahn gap 04/22/2018 9  5 - 15 Final   Performed at [facility], [address], [place], [place] [address]     Pathology Orders Placed This Encounter  Procedures  . CT CHEST W CONTRAST    Standing Status:   Future    Standing Expiration Date:   04/22/2019    Order Specific Question:   If indicated for the ordered procedure, I authorize the administration of contrast media per Radiology protocol    Answer:   Yes    Order Specific Question:   Preferred imaging location?    Answer:   [facility]    Order Specific Question:   Radiology Contrast Protocol - do NOT remove file path    Answer:   [email or web address]  . CBC with Differential/Platelet    Standing Status:   Future    Standing Expiration Date:   04/23/2019  . Comprehensive metabolic panel    Standing Status:   Future    Standing Expiration Date:   04/23/2019  . Lactate dehydrogenase    Standing Status:   Future    Standing Expiration Date:   04/23/2019  . CBC with Differential/Platelet    Standing Status:   Future    Standing Expiration Date:   04/22/2020  . Comprehensive metabolic panel    Standing Status:   Future    Standing Expiration Date:   04/22/2020  . Lactate dehydrogenase    Standing Status:   Future    Standing Expiration Date:   04/22/2020       Zoila Shutter MD

## 2018-05-04 ENCOUNTER — Telehealth (HOSPITAL_COMMUNITY): Payer: Self-pay | Admitting: Internal Medicine

## 2018-05-04 NOTE — Telephone Encounter (Signed)
CT CHEST REQ P2P  SCHEDULED FOR9/26 @ 11:45 WITH HIGGS

## 2018-05-05 ENCOUNTER — Other Ambulatory Visit (HOSPITAL_COMMUNITY): Payer: Self-pay | Admitting: Internal Medicine

## 2018-05-05 DIAGNOSIS — Z1231 Encounter for screening mammogram for malignant neoplasm of breast: Secondary | ICD-10-CM

## 2018-05-09 ENCOUNTER — Ambulatory Visit (HOSPITAL_COMMUNITY): Payer: Medicaid Other

## 2018-05-09 ENCOUNTER — Other Ambulatory Visit (HOSPITAL_COMMUNITY): Payer: Self-pay | Admitting: Nurse Practitioner

## 2018-05-09 DIAGNOSIS — Z720 Tobacco use: Secondary | ICD-10-CM

## 2018-05-09 DIAGNOSIS — R05 Cough: Secondary | ICD-10-CM

## 2018-05-09 DIAGNOSIS — R059 Cough, unspecified: Secondary | ICD-10-CM

## 2018-05-11 ENCOUNTER — Ambulatory Visit (HOSPITAL_COMMUNITY)
Admission: RE | Admit: 2018-05-11 | Discharge: 2018-05-11 | Disposition: A | Discharge status: Home / Self Care | Payer: Medicaid Other | Source: Ambulatory Visit | Attending: Internal Medicine | Admitting: Internal Medicine

## 2018-05-11 ENCOUNTER — Ambulatory Visit (HOSPITAL_COMMUNITY)
Admission: RE | Admit: 2018-05-11 | Discharge: 2018-05-11 | Disposition: A | Discharge status: Home / Self Care | Payer: Medicaid Other | Source: Ambulatory Visit | Attending: Nurse Practitioner | Admitting: Nurse Practitioner

## 2018-05-11 DIAGNOSIS — Z1231 Encounter for screening mammogram for malignant neoplasm of breast: Secondary | ICD-10-CM

## 2018-05-11 DIAGNOSIS — R05 Cough: Secondary | ICD-10-CM | POA: Insufficient documentation

## 2018-05-11 DIAGNOSIS — Z720 Tobacco use: Secondary | ICD-10-CM

## 2018-05-11 DIAGNOSIS — R059 Cough, unspecified: Secondary | ICD-10-CM

## 2018-05-17 ENCOUNTER — Ambulatory Visit: Payer: Medicaid Other | Admitting: "Endocrinology

## 2018-05-19 ENCOUNTER — Telehealth (HOSPITAL_COMMUNITY): Payer: Self-pay | Admitting: *Deleted

## 2018-05-20 NOTE — Telephone Encounter (Signed)
Called the patient with results of CXR per Dr. Melton Alar.   Patient verbalized understanding.

## 2018-06-15 ENCOUNTER — Ambulatory Visit: Payer: Medicaid Other | Admitting: "Endocrinology

## 2018-06-16 ENCOUNTER — Encounter: Payer: Self-pay | Admitting: "Endocrinology

## 2018-06-28 ENCOUNTER — Emergency Department (HOSPITAL_COMMUNITY)
Admission: EM | Admit: 2018-06-28 | Discharge: 2018-06-28 | Disposition: A | Discharge status: Home / Self Care | Payer: Medicaid Other | Attending: Emergency Medicine | Admitting: Emergency Medicine

## 2018-06-28 ENCOUNTER — Encounter (HOSPITAL_COMMUNITY): Payer: Self-pay

## 2018-06-28 ENCOUNTER — Emergency Department (HOSPITAL_COMMUNITY): Payer: Medicaid Other

## 2018-06-28 ENCOUNTER — Other Ambulatory Visit: Payer: Self-pay

## 2018-06-28 DIAGNOSIS — J449 Chronic obstructive pulmonary disease, unspecified: Secondary | ICD-10-CM | POA: Insufficient documentation

## 2018-06-28 DIAGNOSIS — R05 Cough: Secondary | ICD-10-CM | POA: Diagnosis not present

## 2018-06-28 DIAGNOSIS — Z7984 Long term (current) use of oral hypoglycemic drugs: Secondary | ICD-10-CM | POA: Insufficient documentation

## 2018-06-28 DIAGNOSIS — R0789 Other chest pain: Secondary | ICD-10-CM | POA: Insufficient documentation

## 2018-06-28 DIAGNOSIS — F17210 Nicotine dependence, cigarettes, uncomplicated: Secondary | ICD-10-CM | POA: Insufficient documentation

## 2018-06-28 DIAGNOSIS — Z79899 Other long term (current) drug therapy: Secondary | ICD-10-CM | POA: Insufficient documentation

## 2018-06-28 LAB — URINALYSIS, ROUTINE W REFLEX MICROSCOPIC
Bilirubin Urine: NEGATIVE
GLUCOSE, UA: NEGATIVE mg/dL
HGB URINE DIPSTICK: NEGATIVE
Ketones, ur: NEGATIVE mg/dL
Leukocytes, UA: NEGATIVE
Nitrite: NEGATIVE
PROTEIN: NEGATIVE mg/dL
Specific Gravity, Urine: 1.004 — ABNORMAL LOW (ref 1.005–1.030)
pH: 7 (ref 5.0–8.0)

## 2018-06-28 LAB — CBC WITH DIFFERENTIAL/PLATELET
Abs Immature Granulocytes: 0.05 10*3/uL (ref 0.00–0.07)
BASOS ABS: 0.1 10*3/uL (ref 0.0–0.1)
Basophils Relative: 1 %
EOS ABS: 0.2 10*3/uL (ref 0.0–0.5)
Eosinophils Relative: 2 %
HCT: 46.1 % — ABNORMAL HIGH (ref 36.0–46.0)
Hemoglobin: 14.5 g/dL (ref 12.0–15.0)
Immature Granulocytes: 0 %
Lymphocytes Relative: 43 %
Lymphs Abs: 6.5 10*3/uL — ABNORMAL HIGH (ref 0.7–4.0)
MCH: 28.2 pg (ref 26.0–34.0)
MCHC: 31.5 g/dL (ref 30.0–36.0)
MCV: 89.7 fL (ref 80.0–100.0)
Monocytes Absolute: 0.7 10*3/uL (ref 0.1–1.0)
Monocytes Relative: 5 %
NRBC: 0 % (ref 0.0–0.2)
Neutro Abs: 7.7 10*3/uL (ref 1.7–7.7)
Neutrophils Relative %: 49 %
PLATELETS: 264 10*3/uL (ref 150–400)
RBC: 5.14 MIL/uL — AB (ref 3.87–5.11)
RDW: 14.8 % (ref 11.5–15.5)
WBC: 15.2 10*3/uL — AB (ref 4.0–10.5)

## 2018-06-28 LAB — BASIC METABOLIC PANEL
Anion gap: 9 (ref 5–15)
BUN: <5 mg/dL — ABNORMAL LOW (ref 6–20)
CALCIUM: 9.2 mg/dL (ref 8.9–10.3)
CHLORIDE: 104 mmol/L (ref 98–111)
CO2: 25 mmol/L (ref 22–32)
Creatinine, Ser: 0.73 mg/dL (ref 0.44–1.00)
GFR calc non Af Amer: >60 mL/min (ref >60)
Glucose, Bld: 187 mg/dL — ABNORMAL HIGH (ref 70–99)
Potassium: 3.5 mmol/L (ref 3.5–5.1)
SODIUM: 138 mmol/L (ref 135–145)

## 2018-06-28 LAB — D-DIMER, QUANTITATIVE (NOT AT ARMC): D DIMER QUANT: 0.68 ug{FEU}/mL — AB (ref 0.00–0.50)

## 2018-06-28 MED ORDER — OXYCODONE-ACETAMINOPHEN 5-325 MG PO TABS
1.0000 | ORAL_TABLET | Freq: Once | ORAL | Status: AC
  Administered 2018-06-28: 1 via ORAL
  Filled 2018-06-28: qty 1

## 2018-06-28 MED ORDER — IOPAMIDOL (ISOVUE-370) INJECTION 76%
100.0000 mL | Freq: Once | INTRAVENOUS | Status: AC | PRN
  Administered 2018-06-28: 100 mL via INTRAVENOUS

## 2018-06-28 MED ORDER — IBUPROFEN 800 MG PO TABS: 800.0000 mg | ORAL_TABLET | Freq: Three times a day (TID) | ORAL | 0 refills | Status: AC

## 2018-06-28 MED ORDER — HYDROCODONE-ACETAMINOPHEN 5-325 MG PO TABS: ORAL_TABLET | ORAL | 0 refills | Status: DC

## 2018-06-28 NOTE — ED Provider Notes (Signed)
[facility] EMERGENCY DEPARTMENT Provider Note   CSN: [id number] Arrival date & time: 06/28/18  1547     History   Chief Complaint Chief Complaint  Patient presents with  . Flank Pain    HPI [name] [name] is a 47 y.o. female.  HPI   [name] [name] is a 47 y.o. female with hx of leukocytosis, T2DM, and COPD  presents to the Emergency Department complaining of persistent left rib pain for one week.  She describes a sharp, constant pain that is worse with deep breathing and palpation.  She states the pain begins underneath the left breast and radiates into her upper back.  No known injury, but admits to frequent coughing due to smoking. She was seen by her PCP yesterday for this and told it was likely bronchitis.  She was given a steroid injection and started on doxycycline.  She woke with worsening pain this morning.  She denies shortness of breath, fever, chills, abdominal pain, hemoptysis, and dysuria.  No recent travel or hx of DVT/ PE.     Past Medical History:  Diagnosis Date  . Anemia   . Anxiety   . Asthma   . COPD (chronic obstructive pulmonary disease) (HCC)   . Depression   . Fatty liver   . GERD (gastroesophageal reflux disease)   . Hx of gonorrhea   . Shortness of breath     Patient Active Problem List   Diagnosis Date Noted  . Current smoker 05/06/2017  . Multinodular goiter 05/06/2017  . Uncontrolled type 2 diabetes mellitus with complication, without long-term current use of insulin (HCC) 04/22/2017  . Class 1 obesity due to excess calories with serious comorbidity and body mass index (BMI) of 34.0 to 34.9 in adult 04/22/2017  . Mixed hyperlipidemia 04/22/2017  . Leukocytosis 09/09/2016  . Depression 01/10/2013  . Reflux 01/10/2013    Past Surgical History:  Procedure Laterality Date  . cesarean section x 3    . CHOLECYSTECTOMY    . DILITATION & CURRETTAGE/HYSTROSCOPY WITH THERMACHOICE ABLATION N/A 02/28/2013   Procedure: DILATATION &  CURETTAGE/HYSTEROSCOPY WITH THERMACHOICE ABLATION;  Surgeon: Tilda Burrow, MD;  Location: [facility];  Service: Gynecology;  Laterality: N/A;  Total Ablation Therapy Time = 9 minutes 31 seconds  . EUS  04/06/2012   Procedure: ESOPHAGEAL ENDOSCOPIC ULTRASOUND (EUS) RADIAL;  Surgeon: Willis Modena, MD;  Location: [facility];  Service: Endoscopy;  Laterality: N/A;  . TONSILLECTOMY    . TUBAL LIGATION       OB History    Gravida  3   Para  3   Term  0   Preterm  0   AB  0   Living  4     SAB  0   TAB  0   Ectopic  0   Multiple  1   Live Births  4            Home Medications    Prior to Admission medications   Medication Sig Start Date End Date Taking? Authorizing Provider  ALPRAZolam Prudy Feeler) 1 MG tablet Take 1 mg by mouth at bedtime as needed for anxiety or sleep.    [provider]  atorvastatin (LIPITOR) 20 MG tablet Take 1 tablet (20 mg total) daily by mouth. 06/21/17   Nida, Denman George, MD  busPIRone (BUSPAR) 15 MG tablet Take 30 mg by mouth 3 (three) times daily.     [provider]  Cholecalciferol (VITAMIN D3) 5000 units CAPS  TAKE 1 CAPSULE BY MOUTH ONCE DAILY. 07/29/17   Roma KayserNida, Gebreselassie W, MD  Cholecalciferol (VITAMIN D3) 5000 units CAPS TAKE 1 CAPSULE BY MOUTH ONCE DAILY. 11/16/17   Roma KayserNida, Gebreselassie W, MD  esomeprazole (NEXIUM) 40 MG capsule Take 40 mg by mouth daily before breakfast.    [provider]  flunisolide (AEROBID) 250 MCG/ACT inhaler Inhale 2 puffs into the lungs 2 (two) times daily.    [provider]  ibuprofen (ADVIL,MOTRIN) 800 MG tablet Take 800 mg by mouth every 8 (eight) hours as needed for moderate pain.    [provider]  metFORMIN (GLUCOPHAGE) 1000 MG tablet TAKE 1 TABLET BY MOUTH TWICE DAILY WITH FOOD. 01/20/18   Roma KayserNida, Gebreselassie W, MD  Naproxen Sodium (ALEVE PO) Take 1 tablet by mouth every 8 (eight) hours as needed (pain).     [provider]  sitaGLIPtin (JANUVIA) 50  MG tablet Take 50 mg by mouth daily.    [provider]  varenicline (CHANTIX STARTING MONTH PAK) 0.5 MG X 11 & 1 MG X 42 tablet Take one 0.5 mg tablet by mouth once daily for 3 days, then increase to one 0.5 mg tablet twice daily for 4 days, then increase to one 1 mg tablet twice daily. 10/21/17   Hubbard Hartshornawson, Gretchen W, NP  Vilazodone HCl (VIIBRYD) 40 MG TABS Take 40 mg by mouth daily.    [provider]    Family History Family History  Problem Relation Age of Onset  . Cancer Father        lung  . Spina bifida Brother     Social History Social History   Tobacco Use  . Smoking status: Current Every Day Smoker    Packs/day: 2.00    Years: 35.00    Pack years: 70.00    Types: Cigarettes  . Smokeless tobacco: Never Used  . Tobacco comment: never done snuff or chewing tobacco.  Substance Use Topics  . Alcohol use: No  . Drug use: No     Allergies   Codeine and Sulfa antibiotics   Review of Systems Review of Systems  Constitutional: Negative for chills and fever.  HENT: Negative for congestion.   Respiratory: Negative for cough and shortness of breath.   Cardiovascular: Positive for chest pain (left chest wall pain).  Gastrointestinal: Negative for abdominal pain, nausea and vomiting.  Genitourinary: Negative for difficulty urinating, dysuria, flank pain and hematuria.  Musculoskeletal: Negative for neck pain and neck stiffness.  Skin: Negative for rash and wound.  Neurological: Negative for dizziness, weakness, numbness and headaches.     Physical Exam Updated Vital Signs BP 101/81 (BP Location: Right Arm)   Pulse 88   Temp 97.7 F (36.5 C) (Tympanic)   Resp 18   Ht 5\' 1"  (1.549 m)   Wt 75.8 kg   SpO2 96%   BMI 31.55 kg/m   Physical Exam  Constitutional: No distress.  HENT:  Head: Atraumatic.  Mouth/Throat: Oropharynx is clear and moist.  Neck: Normal range of motion. No JVD present.  Cardiovascular: Normal rate, regular rhythm, normal  heart sounds and intact distal pulses.  Pulmonary/Chest: Effort normal and breath sounds normal. She exhibits tenderness (ttp of the left anterior and lateral chest wall.  no crepitus).  Abdominal: Soft. She exhibits no distension and no mass. There is no tenderness. There is no guarding.  Musculoskeletal: Normal range of motion.  Lymphadenopathy:    She has no cervical adenopathy.  Neurological: She is alert. No sensory  deficit.  Skin: Skin is warm. Capillary refill takes less than 2 seconds. She is not diaphoretic.  Psychiatric: She has a normal mood and affect.  Nursing note and vitals reviewed.    ED Treatments / Results  Labs (all labs ordered are listed, but only abnormal results are displayed) Labs Reviewed  BASIC METABOLIC PANEL - Abnormal; Notable for the following components:      Result Value   Glucose, Bld 187 (*)    BUN <5 (*)    All other components within normal limits  CBC WITH DIFFERENTIAL/PLATELET - Abnormal; Notable for the following components:   WBC 15.2 (*)    RBC 5.14 (*)    HCT 46.1 (*)    Lymphs Abs 6.5 (*)    All other components within normal limits  D-DIMER, QUANTITATIVE (NOT AT [facility]) - Abnormal; Notable for the following components:   D-Dimer, Quant 0.68 (*)    All other components within normal limits  URINALYSIS, ROUTINE W REFLEX MICROSCOPIC - Abnormal; Notable for the following components:   Specific Gravity, Urine 1.004 (*)    All other components within normal limits    EKG None  Radiology Dg Ribs Unilateral W/chest Left  Result Date: 06/28/2018 CLINICAL DATA:  Left chest wall/rib pain EXAM: LEFT RIBS AND CHEST - 3+ VIEW COMPARISON:  Chest radiographs dated 05/11/2018 FINDINGS: Lungs are clear.  No pleural effusion or pneumothorax. The heart is normal in size. No displaced left rib fracture is seen. IMPRESSION: No evidence of acute cardiopulmonary disease. No displaced left rib fracture is seen. Electronically Signed   By: Charline Bills  M.D.   On: 06/28/2018 17:29   Ct Angio Chest Pe W And/or Wo Contrast  Result Date: 06/28/2018 CLINICAL DATA:  Chest pain EXAM: CT ANGIOGRAPHY CHEST WITH CONTRAST TECHNIQUE: Multidetector CT imaging of the chest was performed using the standard protocol during bolus administration of intravenous contrast. Multiplanar CT image reconstructions and MIPs were obtained to evaluate the vascular anatomy. CONTRAST:  ISOVUE-370 IOPAMIDOL (ISOVUE-370) INJECTION 76% COMPARISON:  Chest radiograph June 28, 2018 FINDINGS: Cardiovascular: There is no demonstrable pulmonary embolus. There is no thoracic aortic aneurysm or dissection. The visualized great vessels appear unremarkable. There is no evident pericardial effusion or pericardial thickening. Mediastinum/Nodes: Visualized thyroid appears unremarkable. There is no thoracic aortic adenopathy. No esophageal lesions are evident. Upper Abdomen: Visualized upper abdominal structures appear unremarkable except for absence of the gallbladder. Musculoskeletal: There are no blastic or lytic bone lesions. No evident chest wall lesions. Review of the MIP images confirms the above findings. IMPRESSION: 1.No demonstrable pulmonary embolus. No thoracic aortic aneurysm or dissection. 2.  No edema or consolidation.  Scattered small bullae in the lungs. 3.  No evident thoracic adenopathy. 4.  Gallbladder absent. Electronically Signed   By: Bretta Bang III M.D.   On: 06/28/2018 19:20    Procedures Procedures (including critical care time)  Medications Ordered in ED Medications - No data to display   Initial Impression / Assessment and Plan / ED Course  I have reviewed the triage vital signs and the nursing notes.  Pertinent labs & imaging results that were available during my care of the patient were reviewed by me and considered in my medical decision making (see chart for details).     On recheck, pt resting comfortably.  No respiratory distress noted.   CXR neg, D dimer mildly elevated.  Will obtain CT angio.    CT angio negative for PE.  Vital signs reassuring.  Pain improved and reproducible with palpation.  Left chest wall pain is felt to be musculoskeletal.  She agrees to symptomatic treatment and close outpatient follow-up.  Return precautions discussed.  Final Clinical Impressions(s) / ED Diagnoses   Final diagnoses:  Chest wall pain    ED Discharge Orders    None       Pauline Aus, PA-C 06/29/18 1516    Vanetta Mulders, MD 06/30/18 1640

## 2018-06-28 NOTE — Discharge Instructions (Addendum)
Avoid pushing pulling or lifting for at least 1 week.  You may alternate ice and heat to your chest wall.  Follow-up with your primary doctor for recheck.

## 2018-06-28 NOTE — ED Triage Notes (Signed)
Pt presents to ED today with complaints of pain in her left upper side rib area. Pt states pain started 1 week ago. Pt states she has cough. Pt states she was seen at her doctor yesterday. Pt denies urinary symptoms.

## 2018-07-18 ENCOUNTER — Other Ambulatory Visit (HOSPITAL_COMMUNITY): Payer: Medicaid Other

## 2018-10-17 ENCOUNTER — Other Ambulatory Visit (HOSPITAL_COMMUNITY): Payer: Medicaid Other

## 2018-10-18 ENCOUNTER — Other Ambulatory Visit: Payer: Self-pay

## 2018-10-18 ENCOUNTER — Inpatient Hospital Stay (HOSPITAL_COMMUNITY): Payer: Medicaid Other | Attending: Internal Medicine

## 2018-10-18 DIAGNOSIS — Z720 Tobacco use: Secondary | ICD-10-CM

## 2018-10-18 DIAGNOSIS — E1165 Type 2 diabetes mellitus with hyperglycemia: Secondary | ICD-10-CM | POA: Diagnosis not present

## 2018-10-18 DIAGNOSIS — F17210 Nicotine dependence, cigarettes, uncomplicated: Secondary | ICD-10-CM | POA: Insufficient documentation

## 2018-10-18 DIAGNOSIS — R059 Cough, unspecified: Secondary | ICD-10-CM

## 2018-10-18 DIAGNOSIS — D72828 Other elevated white blood cell count: Secondary | ICD-10-CM | POA: Insufficient documentation

## 2018-10-18 DIAGNOSIS — K760 Fatty (change of) liver, not elsewhere classified: Secondary | ICD-10-CM | POA: Insufficient documentation

## 2018-10-18 DIAGNOSIS — N6489 Other specified disorders of breast: Secondary | ICD-10-CM | POA: Diagnosis not present

## 2018-10-18 DIAGNOSIS — R05 Cough: Secondary | ICD-10-CM

## 2018-10-18 LAB — COMPREHENSIVE METABOLIC PANEL
ALBUMIN: 3.6 g/dL (ref 3.5–5.0)
ALK PHOS: 130 U/L — AB (ref 38–126)
ALT: 31 U/L (ref 0–44)
ANION GAP: 7 (ref 5–15)
AST: 25 U/L (ref 15–41)
BILIRUBIN TOTAL: 0.3 mg/dL (ref 0.3–1.2)
CO2: 28 mmol/L (ref 22–32)
Calcium: 9.2 mg/dL (ref 8.9–10.3)
Chloride: 104 mmol/L (ref 98–111)
Creatinine, Ser: 0.63 mg/dL (ref 0.44–1.00)
GFR calc Af Amer: >60 mL/min (ref >60)
GFR calc non Af Amer: >60 mL/min (ref >60)
GLUCOSE: 229 mg/dL — AB (ref 70–99)
POTASSIUM: 4 mmol/L (ref 3.5–5.1)
SODIUM: 139 mmol/L (ref 135–145)
TOTAL PROTEIN: 7.2 g/dL (ref 6.5–8.1)

## 2018-10-18 LAB — CBC WITH DIFFERENTIAL/PLATELET
ABS IMMATURE GRANULOCYTES: 0.04 10*3/uL (ref 0.00–0.07)
BASOS ABS: 0.1 10*3/uL (ref 0.0–0.1)
Basophils Relative: 1 %
EOS ABS: 0.4 10*3/uL (ref 0.0–0.5)
Eosinophils Relative: 3 %
HEMATOCRIT: 46.3 % — AB (ref 36.0–46.0)
HEMOGLOBIN: 14.4 g/dL (ref 12.0–15.0)
IMMATURE GRANULOCYTES: 0 %
LYMPHS ABS: 4.8 10*3/uL — AB (ref 0.7–4.0)
LYMPHS PCT: 39 %
MCH: 28.8 pg (ref 26.0–34.0)
MCHC: 31.1 g/dL (ref 30.0–36.0)
MCV: 92.6 fL (ref 80.0–100.0)
MONOS PCT: 6 %
Monocytes Absolute: 0.7 10*3/uL (ref 0.1–1.0)
NEUTROS PCT: 51 %
Neutro Abs: 6.3 10*3/uL (ref 1.7–7.7)
Platelets: 245 10*3/uL (ref 150–400)
RBC: 5 MIL/uL (ref 3.87–5.11)
RDW: 14.2 % (ref 11.5–15.5)
WBC: 12.3 10*3/uL — ABNORMAL HIGH (ref 4.0–10.5)
nRBC: 0 % (ref 0.0–0.2)

## 2018-10-18 LAB — LACTATE DEHYDROGENASE: LDH: 109 U/L (ref 98–192)

## 2018-10-24 ENCOUNTER — Ambulatory Visit (HOSPITAL_COMMUNITY): Payer: Medicaid Other | Admitting: Hematology

## 2018-11-09 ENCOUNTER — Inpatient Hospital Stay (HOSPITAL_COMMUNITY): Payer: Medicaid Other | Attending: Nurse Practitioner | Admitting: Nurse Practitioner

## 2019-05-28 IMAGING — DX DG CHEST 2V
2 series · 2 of 2 positions shown · non-contrast
Comparison: 02/18/2017 chest/rib radiographs

CLINICAL DATA: Productive cough, shortness of breath, and chest
pain. History of COPD.

EXAM:
CHEST - 2 VIEW

[chest pa]
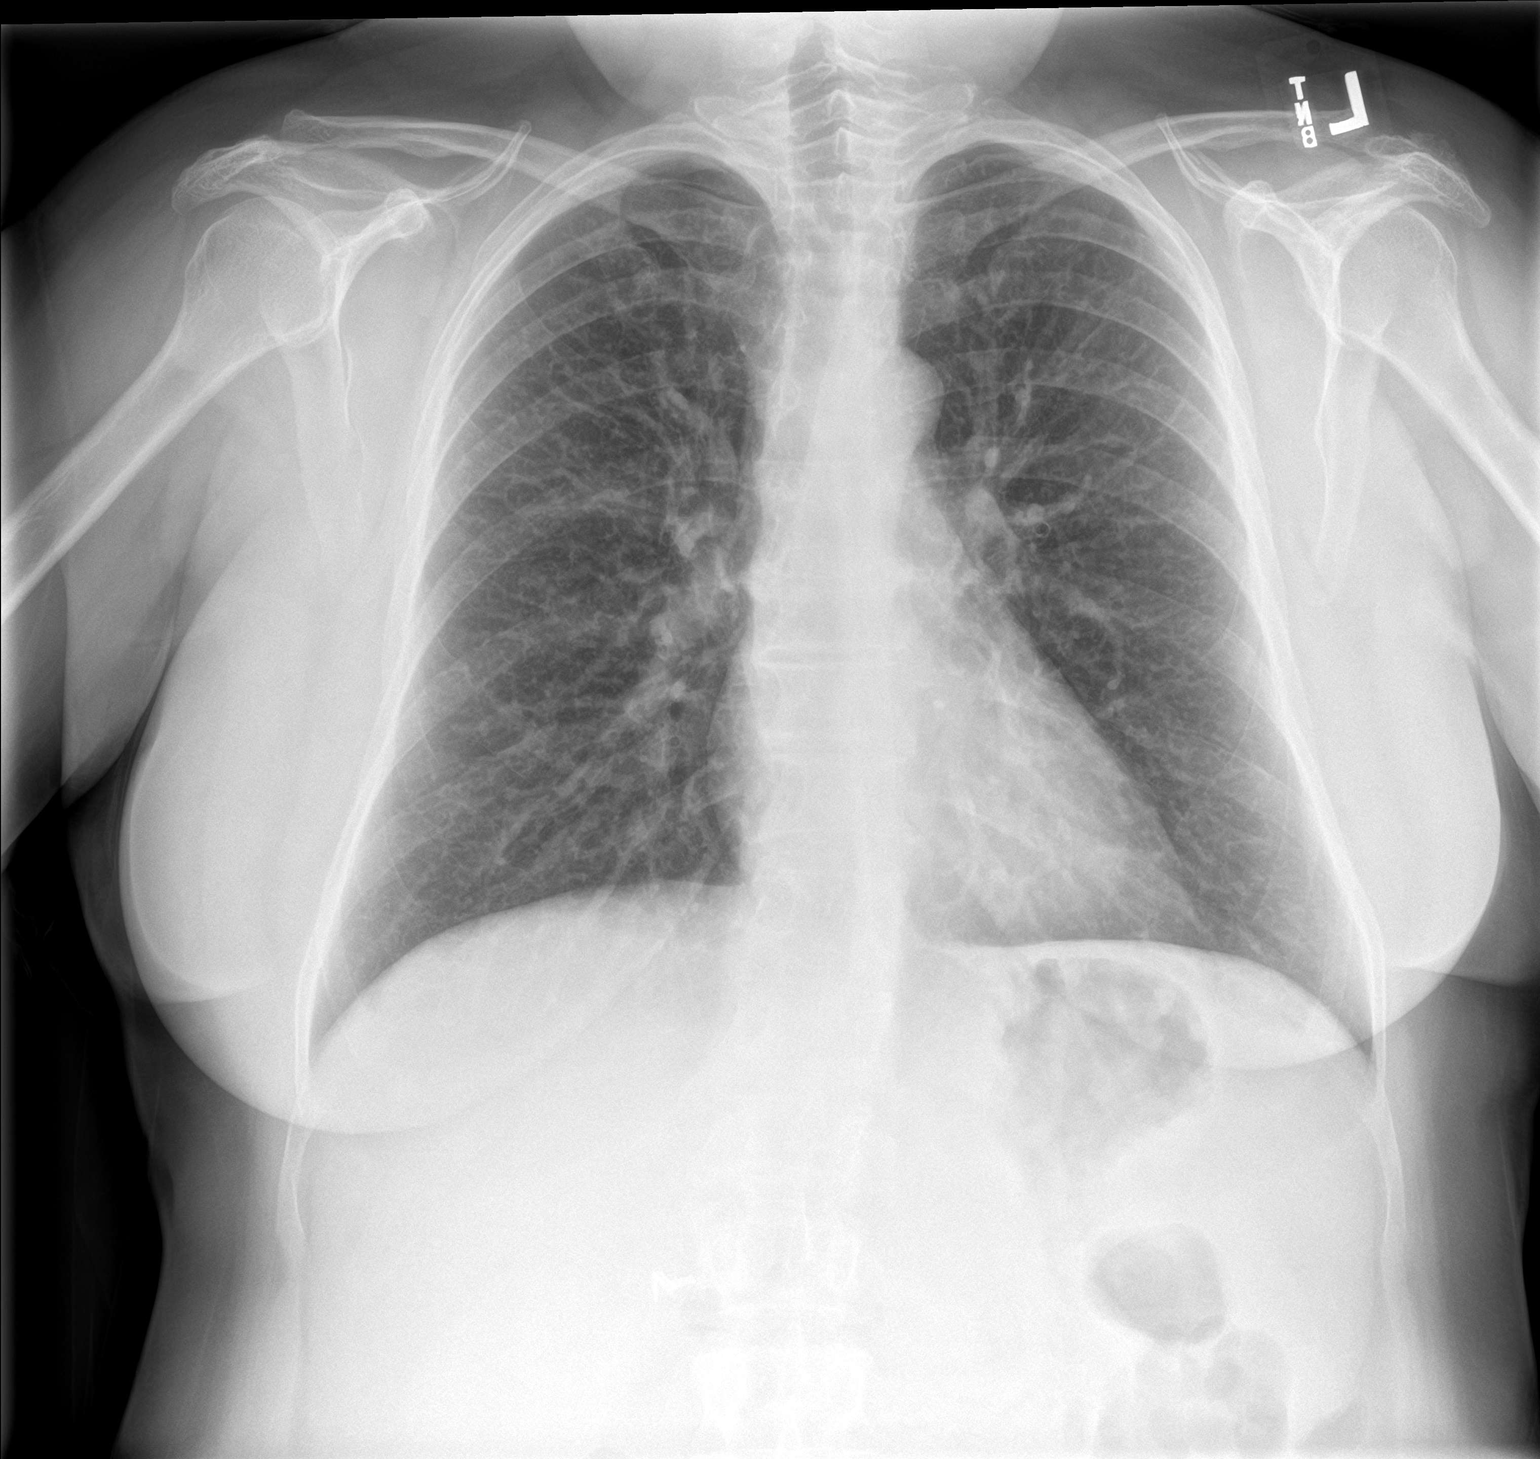

[chest lat]
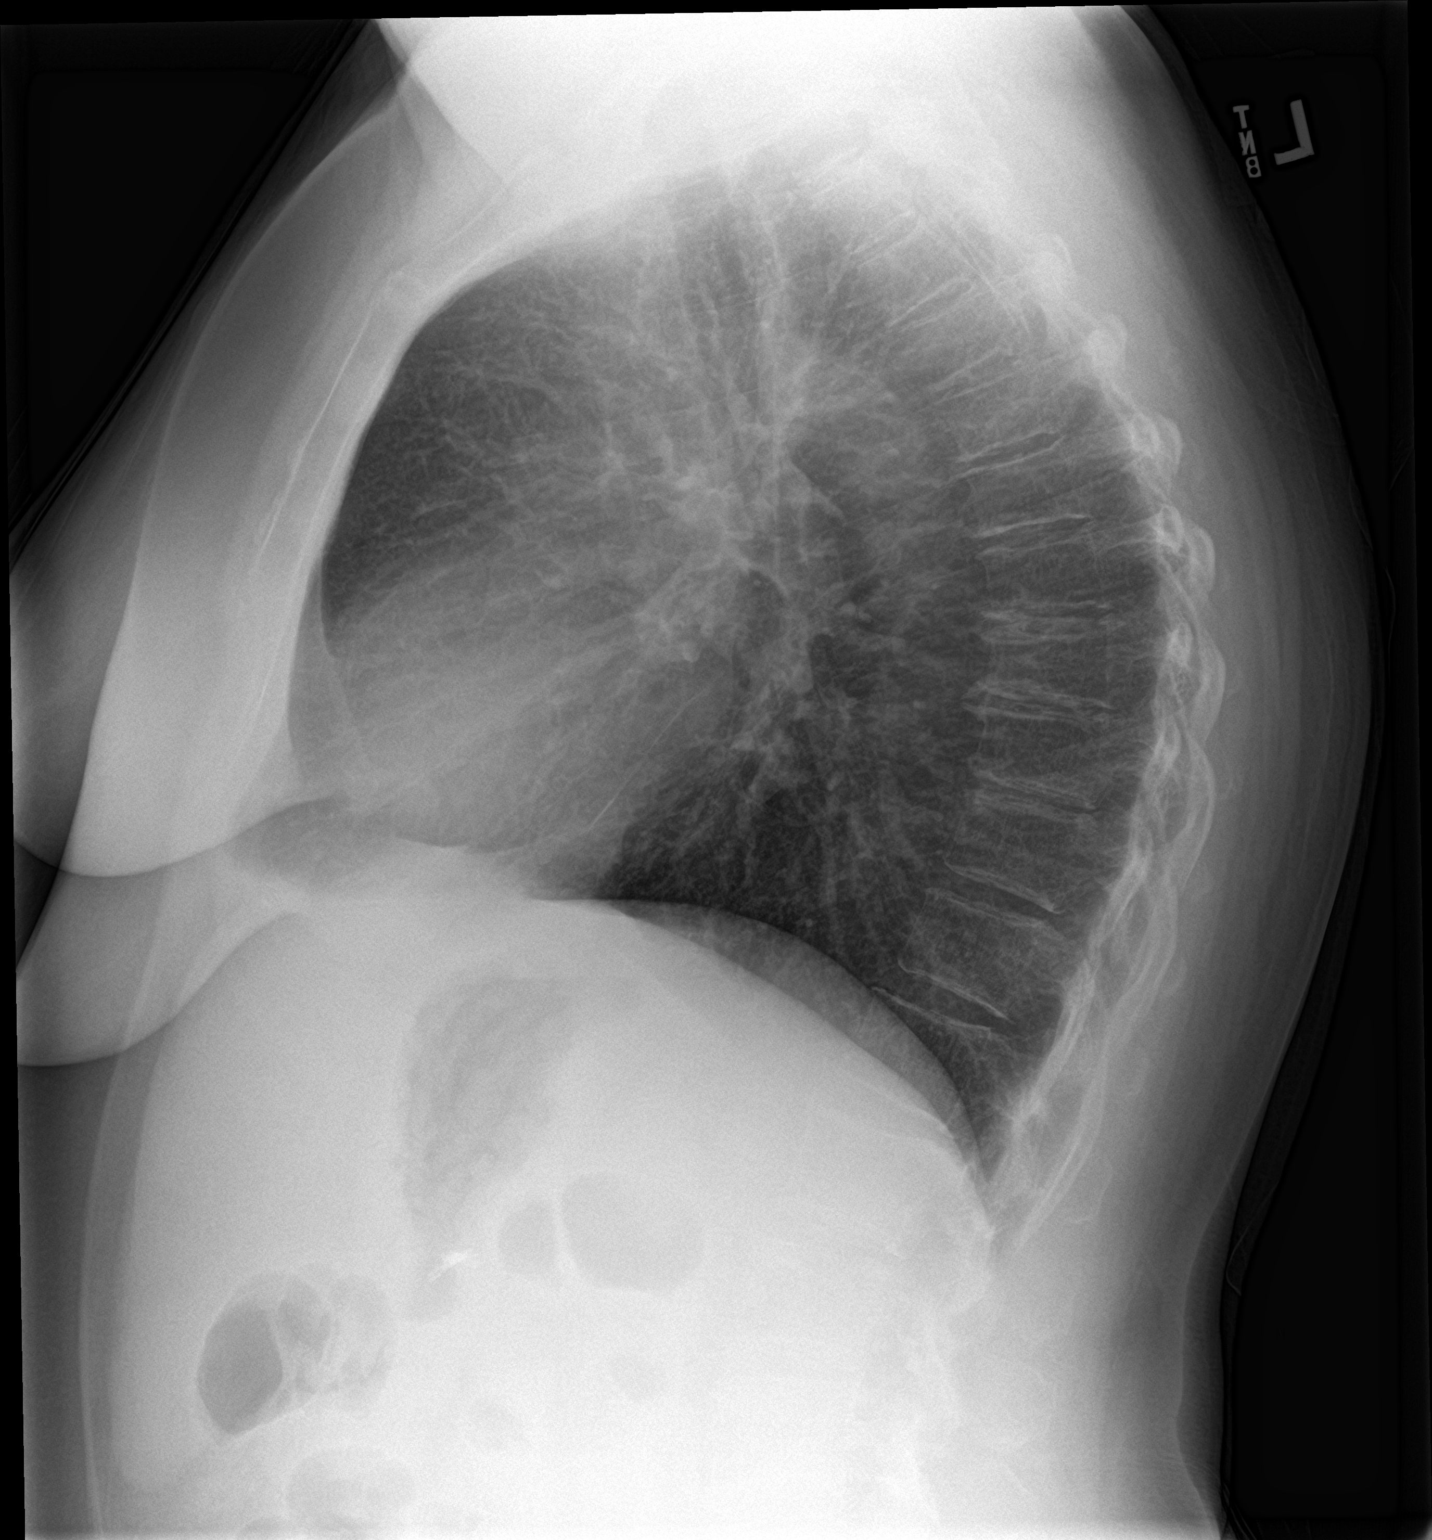

[2 of 2 positions shown; findings below may reference images not displayed]

FINDINGS: The cardiomediastinal silhouette is unchanged with normal heart
size. The lungs are hyperinflated with chronic peribronchial
thickening. No acute airspace consolidation, pleural effusion, or
pneumothorax is identified. Right upper quadrant abdominal surgical
clips are noted. No acute osseous abnormality is seen.
IMPRESSION: COPD without evidence of acute cardiopulmonary disease.

## 2019-10-20 ENCOUNTER — Ambulatory Visit: Payer: Medicaid Other

## 2020-02-12 ENCOUNTER — Emergency Department (HOSPITAL_COMMUNITY)
Admission: EM | Admit: 2020-02-12 | Discharge: 2020-02-12 | Disposition: A | Discharge status: Home / Self Care | Payer: Medicaid Other | Attending: Emergency Medicine | Admitting: Emergency Medicine

## 2020-02-12 ENCOUNTER — Encounter (HOSPITAL_COMMUNITY): Payer: Self-pay | Admitting: Emergency Medicine

## 2020-02-12 ENCOUNTER — Other Ambulatory Visit: Payer: Self-pay

## 2020-02-12 ENCOUNTER — Emergency Department (HOSPITAL_COMMUNITY): Payer: Medicaid Other

## 2020-02-12 DIAGNOSIS — F17210 Nicotine dependence, cigarettes, uncomplicated: Secondary | ICD-10-CM | POA: Diagnosis not present

## 2020-02-12 DIAGNOSIS — J209 Acute bronchitis, unspecified: Secondary | ICD-10-CM | POA: Diagnosis not present

## 2020-02-12 DIAGNOSIS — J449 Chronic obstructive pulmonary disease, unspecified: Secondary | ICD-10-CM | POA: Insufficient documentation

## 2020-02-12 DIAGNOSIS — Z79899 Other long term (current) drug therapy: Secondary | ICD-10-CM | POA: Diagnosis not present

## 2020-02-12 DIAGNOSIS — E119 Type 2 diabetes mellitus without complications: Secondary | ICD-10-CM | POA: Insufficient documentation

## 2020-02-12 DIAGNOSIS — Z7984 Long term (current) use of oral hypoglycemic drugs: Secondary | ICD-10-CM | POA: Diagnosis not present

## 2020-02-12 DIAGNOSIS — J189 Pneumonia, unspecified organism: Secondary | ICD-10-CM

## 2020-02-12 DIAGNOSIS — J45909 Unspecified asthma, uncomplicated: Secondary | ICD-10-CM | POA: Diagnosis not present

## 2020-02-12 DIAGNOSIS — J181 Lobar pneumonia, unspecified organism: Secondary | ICD-10-CM | POA: Insufficient documentation

## 2020-02-12 DIAGNOSIS — R05 Cough: Secondary | ICD-10-CM | POA: Diagnosis present

## 2020-02-12 HISTORY — DX: Type 2 diabetes mellitus without complications: E11.9

## 2020-02-12 MED ORDER — LEVOFLOXACIN 750 MG PO TABS: 750.0000 mg | ORAL_TABLET | Freq: Every day | ORAL | 0 refills | Status: DC

## 2020-02-12 NOTE — ED Provider Notes (Signed)
[facility] EMERGENCY DEPARTMENT Provider Note   CSN: [id number] Arrival date & time: 02/12/20  1937     History Chief Complaint  Patient presents with  . Cough    [name] [name] is a 49 y.o. female.  Patient with complaint of congestion cough and wheezing.  Is been going on for 2 weeks.  Saw her primary care provider 2 weeks ago and told that she had bronchitis and was given a steroid shot.  Patient does use an albuterol inhaler.  No relief seen again last week given steroid pills.  Which she has not completely finished.  And a Z-Pak which she has not completely finished.  Still no relief.  Patient was tested for Covid by primary care doctor and was negative.  Patient also with a reoccurring rash in the lumbar part of the back that comes back about once a year.  Her main concern is the cough and wheezing.        Past Medical History:  Diagnosis Date  . Anemia   . Anxiety   . Asthma   . COPD (chronic obstructive pulmonary disease) (HCC)   . Depression   . Diabetes mellitus without complication (HCC)   . Fatty liver   . GERD (gastroesophageal reflux disease)   . Hx of gonorrhea   . Shortness of breath     Patient Active Problem List   Diagnosis Date Noted  . Current smoker 05/06/2017  . Multinodular goiter 05/06/2017  . Uncontrolled type 2 diabetes mellitus with complication, without long-term current use of insulin (HCC) 04/22/2017  . Class 1 obesity due to excess calories with serious comorbidity and body mass index (BMI) of 34.0 to 34.9 in adult 04/22/2017  . Mixed hyperlipidemia 04/22/2017  . Leukocytosis 09/09/2016  . Depression 01/10/2013  . Reflux 01/10/2013    Past Surgical History:  Procedure Laterality Date  . cesarean section x 3    . CHOLECYSTECTOMY    . DILITATION & CURRETTAGE/HYSTROSCOPY WITH THERMACHOICE ABLATION N/A 02/28/2013   Procedure: DILATATION & CURETTAGE/HYSTEROSCOPY WITH THERMACHOICE ABLATION;  Surgeon: Tilda Burrow, MD;  Location: [facility];  Service: Gynecology;  Laterality: N/A;  Total Ablation Therapy Time = 9 minutes 31 seconds  . EUS  04/06/2012   Procedure: ESOPHAGEAL ENDOSCOPIC ULTRASOUND (EUS) RADIAL;  Surgeon: Willis Modena, MD;  Location: [facility];  Service: Endoscopy;  Laterality: N/A;  . TONSILLECTOMY    . TUBAL LIGATION       OB History    Gravida  3   Para  3   Term  0   Preterm  0   AB  0   Living  4     SAB  0   TAB  0   Ectopic  0   Multiple  1   Live Births  4           Family History  Problem Relation Age of Onset  . Cancer Father        lung  . Spina bifida Brother     Social History   Tobacco Use  . Smoking status: Current Every Day Smoker    Packs/day: 2.00    Years: 35.00    Pack years: 70.00    Types: Cigarettes  . Smokeless tobacco: Never Used  . Tobacco comment: never done snuff or chewing tobacco.  Vaping Use  . Vaping Use: Never used  Substance Use Topics  . Alcohol use: No  . Drug use: No  Home Medications Prior to Admission medications   Medication Sig Start Date End Date Taking? Authorizing Provider  ALPRAZolam Prudy Feeler) 1 MG tablet Take 1 mg by mouth at bedtime as needed for anxiety or sleep.   Yes [provider]  atorvastatin (LIPITOR) 20 MG tablet Take 1 tablet (20 mg total) daily by mouth. 06/21/17  Yes Nida, Denman George, MD  esomeprazole (NEXIUM) 40 MG capsule Take 40 mg by mouth daily before breakfast.   Yes [provider]  flunisolide (AEROBID) 250 MCG/ACT inhaler Inhale 2 puffs into the lungs 2 (two) times daily.   Yes [provider]  ibuprofen (ADVIL,MOTRIN) 800 MG tablet Take 1 tablet (800 mg total) by mouth 3 (three) times daily. Patient taking differently: Take 800 mg by mouth every 8 (eight) hours as needed for mild pain or moderate pain.  06/28/18  Yes Triplett, Tammy, PA-C  sitaGLIPtin (JANUVIA) 100 MG tablet Take 100 mg by mouth daily.    Yes [provider]  Vilazodone HCl (VIIBRYD) 40  MG TABS Take 40 mg by mouth daily.   Yes [provider]  levofloxacin (LEVAQUIN) 750 MG tablet Take 1 tablet (750 mg total) by mouth daily. 02/12/20   Vanetta Mulders, MD    Allergies    Codeine and Sulfa antibiotics  Review of Systems   Review of Systems  Constitutional: Negative for chills and fever.  HENT: Positive for congestion. Negative for rhinorrhea and sore throat.   Eyes: Negative for visual disturbance.  Respiratory: Positive for cough and wheezing. Negative for shortness of breath.   Cardiovascular: Negative for chest pain and leg swelling.  Gastrointestinal: Negative for abdominal pain, diarrhea, nausea and vomiting.  Genitourinary: Negative for dysuria.  Musculoskeletal: Negative for back pain and neck pain.  Skin: Negative for rash.  Neurological: Negative for dizziness, light-headedness and headaches.  Hematological: Does not bruise/bleed easily.  Psychiatric/Behavioral: Negative for confusion.    Physical Exam Updated Vital Signs BP 125/80 (BP Location: Right Arm)   Pulse 96   Temp 97.9 F (36.6 C) (Oral)   Resp 19   Ht 1.549 m (5\' 1" )   Wt 74.4 kg   SpO2 98%   BMI 30.99 kg/m   Physical Exam Vitals and nursing note reviewed.  Constitutional:      General: She is not in acute distress.    Appearance: Normal appearance. She is well-developed.  HENT:     Head: Normocephalic and atraumatic.  Eyes:     Extraocular Movements: Extraocular movements intact.     Conjunctiva/sclera: Conjunctivae normal.     Pupils: Pupils are equal, round, and reactive to light.  Cardiovascular:     Rate and Rhythm: Normal rate and regular rhythm.     Heart sounds: No murmur heard.   Pulmonary:     Effort: Pulmonary effort is normal. No respiratory distress.     Breath sounds: Normal breath sounds. No wheezing.     Comments: No wheezing. Abdominal:     Palpations: Abdomen is soft.     Tenderness: There is no abdominal tenderness.  Musculoskeletal:         General: Normal range of motion.     Cervical back: Normal range of motion and neck supple.     Comments: Lumbar part of the back with 2 spherical areas of like of the cyst Keeler rash.  Kind of L4 and L3 area.  Measuring about 1 to 2 cm.  1 is kind of scabbed over.  Certainly does not follow  dermatome.  Skin:    General: Skin is warm and dry.  Neurological:     General: No focal deficit present.     Mental Status: She is alert and oriented to person, place, and time.     Cranial Nerves: No cranial nerve deficit.     ED Results / Procedures / Treatments   Labs (all labs ordered are listed, but only abnormal results are displayed) Labs Reviewed - No data to display  EKG None  Radiology DG Chest 2 View  Result Date: 02/12/2020 CLINICAL DATA:  Cough, diagnosis of bronchitis 2 weeks prior EXAM: CHEST - 2 VIEW COMPARISON:  Radiograph 01/02/2020, CT 06/28/2018 FINDINGS: Minimal airways thickening. There is in ill-defined opacity in the left lung base favored to reflect superimposition of the soft tissues and vasculature over the rib. No pneumothorax or effusion. No convincing features of edema. The aorta is calcified. The remaining cardiomediastinal contours are unremarkable. No acute osseous or soft tissue abnormality. Degenerative changes are present in the imaged spine and shoulders. Cholecystectomy clips in the right upper quadrant. IMPRESSION: Minimal airways thickening, can be seen with bronchitis or reactive airways disease. Ill-defined opacity in the left lung base favored to reflect superimposed tissues rather than infection. Could consider short-term follow-up radiograph after trial of therapeutics to ensure resolution. Electronically Signed   By: Kreg Shropshire M.D.   On: 02/12/2020 21:37    Procedures Procedures (including critical care time)  Medications Ordered in ED Medications - No data to display  ED Course  I have reviewed the triage vital signs and the nursing  notes.  Pertinent labs & imaging results that were available during my care of the patient were reviewed by me and considered in my medical decision making (see chart for details).    MDM Rules/Calculators/A&P                         No wheezing.  Chest x-ray here raises some concerns about a left lower lobe infiltrate.  Patient nontoxic no fever oxygen saturations on room air 98% heart rate 96 respirations 19 blood pressure 125/80.  Again no wheezing.  Patient is finishing up a Z-Pak finishing up a course of oral steroids.  Patient is using albuterol inhaler at home.  Will have switch patient over to Levaquin have her follow back up with a primary care doctor and will refer her to dermatology regarding the recurring rash that happens about once a year.  Not exactly sure what that is.  Final Clinical Impression(s) / ED Diagnoses Final diagnoses:  Acute bronchitis, unspecified organism  Community acquired pneumonia of left lower lobe of lung    Rx / DC Orders ED Discharge Orders         Ordered    levofloxacin (LEVAQUIN) 750 MG tablet  Daily     Discontinue  Reprint     02/12/20 2314           Vanetta Mulders, MD 02/12/20 2320

## 2020-02-12 NOTE — ED Triage Notes (Signed)
Pt c/o stuffy nose and cough and wheezing. Pt states she was seen by her pcp 2 weeks ago and pcp stated she has bronchitis and given a steroid shot, pt had no relief and was seen again last week and given steroid pills and a z-pak and states she still has no relief. Pt states she was tested for covid and it was negative.

## 2020-02-12 NOTE — ED Notes (Signed)
Patient taken to xray.

## 2020-02-12 NOTE — Discharge Instructions (Addendum)
Questionable pneumonia left lower lung.  Finish out the Z-Pak.  Start the Levaquin.  Continue the albuterol inhaler.  Finish out the prednisone.  Make an appointment to follow-up with your regular doctor to be reevaluated at the end of this week or early next week.  Return for any new or worse symptoms.  Referral information provided to dermatology. You call dermatology for follow-up regarding the recurring rash in the lumbar part of the back area.

## 2020-02-21 ENCOUNTER — Other Ambulatory Visit (HOSPITAL_COMMUNITY): Payer: Self-pay | Admitting: *Deleted

## 2020-02-21 DIAGNOSIS — Z720 Tobacco use: Secondary | ICD-10-CM

## 2020-02-22 ENCOUNTER — Encounter (HOSPITAL_COMMUNITY): Payer: Self-pay | Admitting: Nurse Practitioner

## 2020-02-22 ENCOUNTER — Other Ambulatory Visit: Payer: Self-pay

## 2020-02-22 ENCOUNTER — Inpatient Hospital Stay (HOSPITAL_COMMUNITY): Payer: Medicaid Other

## 2020-02-22 ENCOUNTER — Inpatient Hospital Stay (HOSPITAL_COMMUNITY): Payer: Medicaid Other | Attending: Nurse Practitioner | Admitting: Nurse Practitioner

## 2020-02-22 DIAGNOSIS — F329 Major depressive disorder, single episode, unspecified: Secondary | ICD-10-CM | POA: Diagnosis not present

## 2020-02-22 DIAGNOSIS — D72829 Elevated white blood cell count, unspecified: Secondary | ICD-10-CM

## 2020-02-22 DIAGNOSIS — K219 Gastro-esophageal reflux disease without esophagitis: Secondary | ICD-10-CM | POA: Diagnosis not present

## 2020-02-22 DIAGNOSIS — Z7984 Long term (current) use of oral hypoglycemic drugs: Secondary | ICD-10-CM | POA: Insufficient documentation

## 2020-02-22 DIAGNOSIS — J440 Chronic obstructive pulmonary disease with acute lower respiratory infection: Secondary | ICD-10-CM | POA: Diagnosis not present

## 2020-02-22 DIAGNOSIS — F17210 Nicotine dependence, cigarettes, uncomplicated: Secondary | ICD-10-CM | POA: Insufficient documentation

## 2020-02-22 DIAGNOSIS — Z720 Tobacco use: Secondary | ICD-10-CM

## 2020-02-22 DIAGNOSIS — Z79899 Other long term (current) drug therapy: Secondary | ICD-10-CM | POA: Diagnosis not present

## 2020-02-22 DIAGNOSIS — E119 Type 2 diabetes mellitus without complications: Secondary | ICD-10-CM | POA: Insufficient documentation

## 2020-02-22 DIAGNOSIS — F419 Anxiety disorder, unspecified: Secondary | ICD-10-CM | POA: Diagnosis not present

## 2020-02-22 DIAGNOSIS — Z801 Family history of malignant neoplasm of trachea, bronchus and lung: Secondary | ICD-10-CM | POA: Diagnosis not present

## 2020-02-22 LAB — CBC WITH DIFFERENTIAL/PLATELET
Abs Immature Granulocytes: 0.09 10*3/uL — ABNORMAL HIGH (ref 0.00–0.07)
Basophils Absolute: 0.1 10*3/uL (ref 0.0–0.1)
Basophils Relative: 0 %
Eosinophils Absolute: 0.4 10*3/uL (ref 0.0–0.5)
Eosinophils Relative: 2 %
HCT: 43.6 % (ref 36.0–46.0)
Hemoglobin: 14.2 g/dL (ref 12.0–15.0)
Immature Granulocytes: 0 %
Lymphocytes Relative: 36 %
Lymphs Abs: 7.3 10*3/uL — ABNORMAL HIGH (ref 0.7–4.0)
MCH: 30.7 pg (ref 26.0–34.0)
MCHC: 32.6 g/dL (ref 30.0–36.0)
MCV: 94.4 fL (ref 80.0–100.0)
Monocytes Absolute: 1.2 10*3/uL — ABNORMAL HIGH (ref 0.1–1.0)
Monocytes Relative: 6 %
Neutro Abs: 11.3 10*3/uL — ABNORMAL HIGH (ref 1.7–7.7)
Neutrophils Relative %: 56 %
Platelets: 253 10*3/uL (ref 150–400)
RBC: 4.62 MIL/uL (ref 3.87–5.11)
RDW: 14.3 % (ref 11.5–15.5)
WBC: 20.4 10*3/uL — ABNORMAL HIGH (ref 4.0–10.5)
nRBC: 0 % (ref 0.0–0.2)

## 2020-02-22 LAB — COMPREHENSIVE METABOLIC PANEL
ALT: 45 U/L — ABNORMAL HIGH (ref 0–44)
AST: 34 U/L (ref 15–41)
Albumin: 3.5 g/dL (ref 3.5–5.0)
Alkaline Phosphatase: 141 U/L — ABNORMAL HIGH (ref 38–126)
Anion gap: 8 (ref 5–15)
BUN: 6 mg/dL (ref 6–20)
CO2: 27 mmol/L (ref 22–32)
Calcium: 8.7 mg/dL — ABNORMAL LOW (ref 8.9–10.3)
Chloride: 104 mmol/L (ref 98–111)
Creatinine, Ser: 0.52 mg/dL (ref 0.44–1.00)
GFR calc Af Amer: >60 mL/min (ref >60)
GFR calc non Af Amer: >60 mL/min (ref >60)
Glucose, Bld: 158 mg/dL — ABNORMAL HIGH (ref 70–99)
Potassium: 3.4 mmol/L — ABNORMAL LOW (ref 3.5–5.1)
Sodium: 139 mmol/L (ref 135–145)
Total Bilirubin: 0.7 mg/dL (ref 0.3–1.2)
Total Protein: 6.9 g/dL (ref 6.5–8.1)

## 2020-02-22 LAB — LACTATE DEHYDROGENASE: LDH: 120 U/L (ref 98–192)

## 2020-02-22 NOTE — Progress Notes (Signed)
[facility] [address], [place] [address]   CLINIC:  Medical Oncology/Hematology  PCP:  Lianne Moris, PA-C [address] [place] [address] [phone]   REASON FOR VISIT: Follow-up for leukocytosis   CURRENT THERAPY: Observation  INTERVAL HISTORY:  [name] 49 y.o. female returns for routine follow-up for leukocytosis.  Patient was upset that her white blood counts had increased.  She was enraged that we told her it was due from the antibiotics and her smoking.  She was cussing and yelling at me and stormed out of the office and said she was not coming back.     REVIEW OF SYSTEMS:  Review of Systems  All other systems reviewed and are negative.    PAST MEDICAL/SURGICAL HISTORY:  Past Medical History:  Diagnosis Date   Anemia    Anxiety    Asthma    COPD (chronic obstructive pulmonary disease) (HCC)    Depression    Diabetes mellitus without complication (HCC)    Fatty liver    GERD (gastroesophageal reflux disease)    Hx of gonorrhea    Shortness of breath    Past Surgical History:  Procedure Laterality Date   cesarean section x 3     CHOLECYSTECTOMY     DILITATION & CURRETTAGE/HYSTROSCOPY WITH THERMACHOICE ABLATION N/A 02/28/2013   Procedure: DILATATION & CURETTAGE/HYSTEROSCOPY WITH THERMACHOICE ABLATION;  Surgeon: Tilda Burrow, MD;  Location: [facility];  Service: Gynecology;  Laterality: N/A;  Total Ablation Therapy Time = 9 minutes 31 seconds   EUS  04/06/2012   Procedure: ESOPHAGEAL ENDOSCOPIC ULTRASOUND (EUS) RADIAL;  Surgeon: Willis Modena, MD;  Location: [facility];  Service: Endoscopy;  Laterality: N/A;   TONSILLECTOMY     TUBAL LIGATION       SOCIAL HISTORY:  Social History   Socioeconomic History   Marital status: Single    Spouse name: Not on file   Number of children: Not on file   Years of education: Not on file   Highest education level: Not on file  Occupational History   Not on file    Tobacco Use   Smoking status: Current Every Day Smoker    Packs/day: 2.00    Years: 35.00    Pack years: 70.00    Types: Cigarettes   Smokeless tobacco: Never Used   Tobacco comment: never done snuff or chewing tobacco.  Vaping Use   Vaping Use: Never used  Substance and Sexual Activity   Alcohol use: No   Drug use: No   Sexual activity: Not Currently  Other Topics Concern   Not on file  Social History Narrative   Not on file   Social Determinants of Health   Financial Resource Strain:    Difficulty of Paying Living Expenses:   Food Insecurity:    Worried About Programme researcher, broadcasting/film/video in the Last Year:    Barista in the Last Year:   Transportation Needs:    Freight forwarder (Medical):    Lack of Transportation (Non-Medical):   Physical Activity:    Days of Exercise per Week:    Minutes of Exercise per Session:   Stress:    Feeling of Stress :   Social Connections:    Frequency of Communication with Friends and Family:    Frequency of Social Gatherings with Friends and Family:    Attends Religious Services:    Active Member of Clubs or Organizations:    Attends [employer]  or Organization Meetings:    Marital Status:   Intimate Partner Violence:    Fear of Current or Ex-Partner:    Emotionally Abused:    Physically Abused:    Sexually Abused:     FAMILY HISTORY:  Family History  Problem Relation Age of Onset   Cancer Father        lung   Spina bifida Brother     CURRENT MEDICATIONS:  Outpatient Encounter Medications as of 02/22/2020  Medication Sig   ALPRAZolam (XANAX) 1 MG tablet Take 1 mg by mouth at bedtime as needed for anxiety or sleep.   atorvastatin (LIPITOR) 20 MG tablet Take 1 tablet (20 mg total) daily by mouth.   esomeprazole (NEXIUM) 40 MG capsule Take 40 mg by mouth daily before breakfast.   flunisolide (AEROBID) 250 MCG/ACT inhaler Inhale 2 puffs into the lungs 2 (two) times daily.   ibuprofen  (ADVIL,MOTRIN) 800 MG tablet Take 1 tablet (800 mg total) by mouth 3 (three) times daily. (Patient taking differently: Take 800 mg by mouth every 8 (eight) hours as needed for mild pain or moderate pain. )   levofloxacin (LEVAQUIN) 750 MG tablet Take 1 tablet (750 mg total) by mouth daily.   sitaGLIPtin (JANUVIA) 100 MG tablet Take 100 mg by mouth daily.    Vilazodone HCl (VIIBRYD) 40 MG TABS Take 40 mg by mouth daily.   No facility-administered encounter medications on file as of 02/22/2020.    ALLERGIES:  Allergies  Allergen Reactions   Codeine Nausea And Vomiting   Sulfa Antibiotics Nausea And Vomiting     PHYSICAL EXAM:  ECOG Performance status: 1  Vitals:   02/22/20 1134  BP: 106/67  Pulse: 90  Resp: 18  Temp: (!) 97 F (36.1 C)  SpO2: 98%   Filed Weights   02/22/20 1134  Weight: 166 lb 7.2 oz (75.5 kg)   Physical Exam Was not completed due to patient storming out of office.  LABORATORY DATA:  I have reviewed the labs as listed.  CBC    Component Value Date/Time   WBC 20.4 (H) 02/22/2020 1001   RBC 4.62 02/22/2020 1001   HGB 14.2 02/22/2020 1001   HCT 43.6 02/22/2020 1001   PLT 253 02/22/2020 1001   MCV 94.4 02/22/2020 1001   MCH 30.7 02/22/2020 1001   MCHC 32.6 02/22/2020 1001   RDW 14.3 02/22/2020 1001   LYMPHSABS 7.3 (H) 02/22/2020 1001   MONOABS 1.2 (H) 02/22/2020 1001   EOSABS 0.4 02/22/2020 1001   BASOSABS 0.1 02/22/2020 1001   CMP Latest Ref Rng & Units 02/22/2020 10/18/2018 06/28/2018  Glucose 70 - 99 mg/dL 789(F) 810(F) 751(W)  BUN 6 - 20 mg/dL 6 <2(H) <8(N)  Creatinine 0.44 - 1.00 mg/dL 2.77 8.24 2.35  Sodium 135 - 145 mmol/L 139 139 138  Potassium 3.5 - 5.1 mmol/L 3.4(L) 4.0 3.5  Chloride 98 - 111 mmol/L 104 104 104  CO2 22 - 32 mmol/L 27 28 25   Calcium 8.9 - 10.3 mg/dL ) 9.2 9.2  Total Protein 6.5 - 8.1 g/dL 6.9 7.2 -  Total Bilirubin 0.3 - 1.2 mg/dL 0.7 0.3 -  Alkaline Phos 38 - 126 U/L 141(H) 130(H) -  AST 15 - 41 U/L 34 25 -   ALT 0 - 44 U/L 45(H) 31 -      ASSESSMENT & PLAN:  Leukocytosis 1.  Leukocytosis: -This is secondary to chronic cigarette smoking. -Myeloproliferative work-up negative, BCR ABL and JAK2 mutations both negative.  Peripheral  flow cytometry negative. -WBCs improved mildly when she cut back on her smoking. -She is not been for a visit to our office in over 2 years. -She made a follow-up appointment due to having her labs checked on 02/16/2020 and her WBCs were 20.2 at an outside facility. -She was placed on antibiotics Levaquin on 02/12/2020 in the emergency room for pneumonia.  This would explain her increase in her WBCs. -Labs that were repeated on 02/22/2020 showed her WBC at 20.4. -Patient became enraged in the room and was yelling and cussing saying that her increased white counts did not come from antibiotics and smoking.  She called all the doctors idiots and stormed out of the office and said she was not coming back. -We have discharged her from the clinic.     Orders placed this encounter:  No orders of the defined types were placed in this encounter.     Mathis Bud, FNP-C [facility] [employer] [phone]

## 2020-02-22 NOTE — Assessment & Plan Note (Signed)
1.  Leukocytosis: -This is secondary to chronic cigarette smoking. -Myeloproliferative work-up negative, BCR ABL and JAK2 mutations both negative.  Peripheral flow cytometry negative. -WBCs improved mildly when she cut back on her smoking. -She is not been for a visit to our office in over 2 years. -She made a follow-up appointment due to having her labs checked on 02/16/2020 and her WBCs were 20.2 at an outside facility. -She was placed on antibiotics Levaquin on 02/12/2020 in the emergency room for pneumonia.  This would explain her increase in her WBCs. -Labs that were repeated on 02/22/2020 showed her WBC at 20.4. -Patient became enraged in the room and was yelling and cussing saying that her increased white counts did not come from antibiotics and smoking.  She called all the doctors idiots and stormed out of the office and said she was not coming back. -We have discharged her from the clinic.

## 2020-02-26 ENCOUNTER — Encounter (HOSPITAL_COMMUNITY): Payer: Self-pay | Admitting: *Deleted

## 2020-02-27 ENCOUNTER — Encounter (HOSPITAL_COMMUNITY): Payer: Self-pay | Admitting: *Deleted

## 2021-05-05 ENCOUNTER — Other Ambulatory Visit: Payer: Self-pay | Admitting: Family Medicine

## 2021-05-05 DIAGNOSIS — Z1231 Encounter for screening mammogram for malignant neoplasm of breast: Secondary | ICD-10-CM

## 2021-05-06 ENCOUNTER — Ambulatory Visit: Payer: Medicaid Other

## 2021-10-30 ENCOUNTER — Other Ambulatory Visit (HOSPITAL_COMMUNITY): Payer: Self-pay | Admitting: Hematology and Oncology

## 2021-10-30 DIAGNOSIS — R918 Other nonspecific abnormal finding of lung field: Secondary | ICD-10-CM

## 2021-11-13 ENCOUNTER — Encounter (HOSPITAL_COMMUNITY)
Admission: RE | Admit: 2021-11-13 | Discharge: 2021-11-13 | Disposition: A | Discharge status: Home / Self Care | Payer: Medicaid Other | Source: Ambulatory Visit | Attending: Hematology and Oncology | Admitting: Hematology and Oncology

## 2021-11-13 DIAGNOSIS — R918 Other nonspecific abnormal finding of lung field: Secondary | ICD-10-CM

## 2021-11-13 MED ORDER — FLUDEOXYGLUCOSE F - 18 (FDG) INJECTION
7.9000 | Freq: Once | INTRAVENOUS | Status: AC | PRN
  Administered 2021-11-13: 7.9 via INTRAVENOUS

## 2021-12-08 HISTORY — PX: LUNG LOBECTOMY: SHX167

## 2022-11-30 IMAGING — PT NM PET TUM IMG INITIAL (PI) SKULL BASE T - THIGH
7 series · 25 of 25 positions shown · non-contrast
Comparison: 10/08/2021 chest CT.

CLINICAL DATA: Initial treatment strategy for right upper lobe lung
mass.

EXAM:
NUCLEAR MEDICINE PET SKULL BASE TO THIGH
TECHNIQUE: 7.9 mCi F-18 FDG was injected intravenously. Full-ring PET imaging
was performed from the skull base to thigh after the radiotracer. CT
data was obtained and used for attenuation correction and anatomic
localization.
Fasting blood glucose: 101 mg/dl

[Series 4: ctac · axial · 3.0mm · 0.98mm/px · z∈[-902,-26]mm · 4 of 293 slices shown]
[im 1/293]
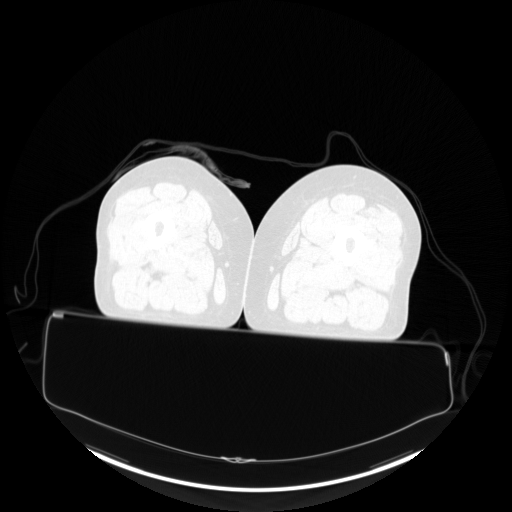
[im 98/293]
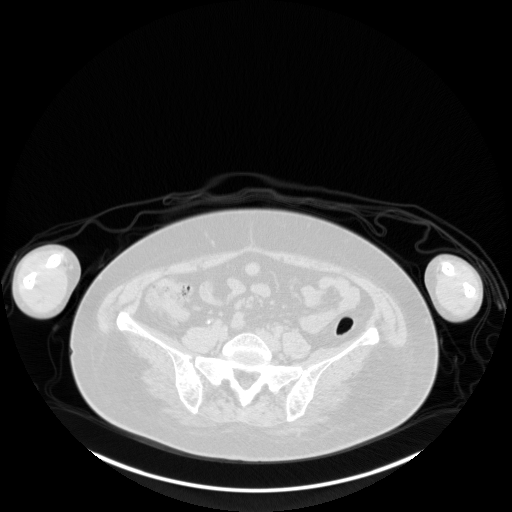
[im 195/293]
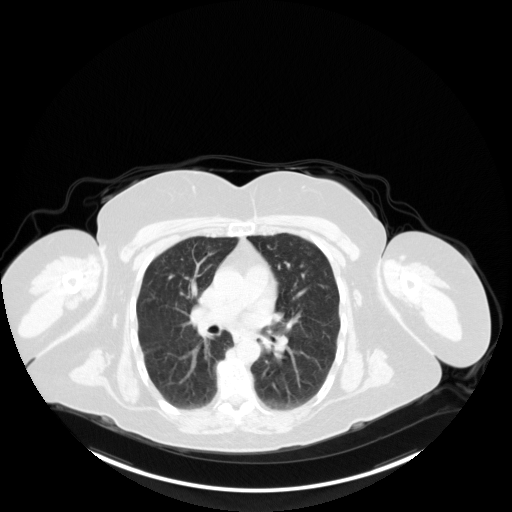
[im 293/293  brain]
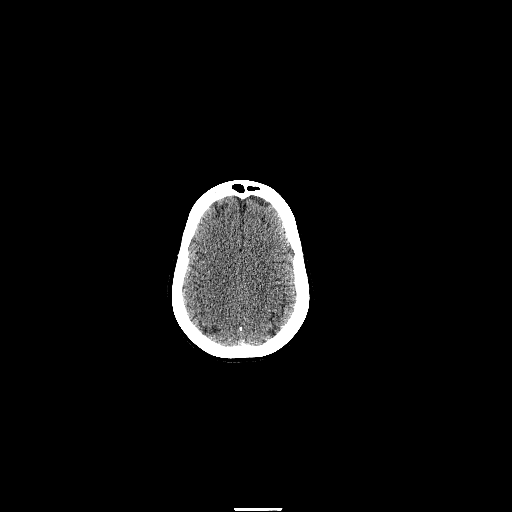

[Series 5: pet ac · axial · 3.0mm · 4.11mm/px · z∈[-902,-26]mm · 5 of 293 slices shown]
[im 1/293]
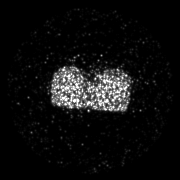
[im 74/293]
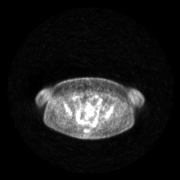
[im 147/293]
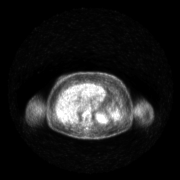
[im 220/293]
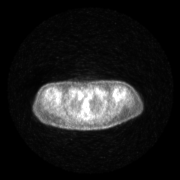
[im 293/293]
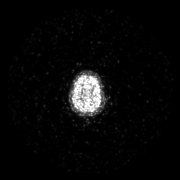

[Series 6: pet nac · axial · 3.0mm · 4.11mm/px · z∈[-902,-26]mm · 5 of 293 slices shown]
[im 1/293]
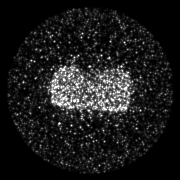
[im 74/293]
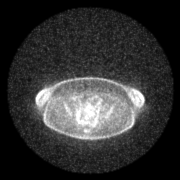
[im 147/293]
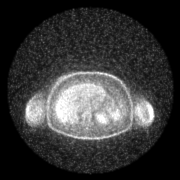
[im 220/293]
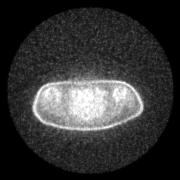
[im 293/293]
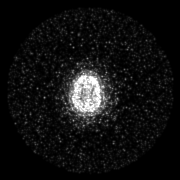

[Series 8: ct lung · axial · 3.0mm · 0.98mm/px · 1 of 86 slices shown]
[im 1/86]
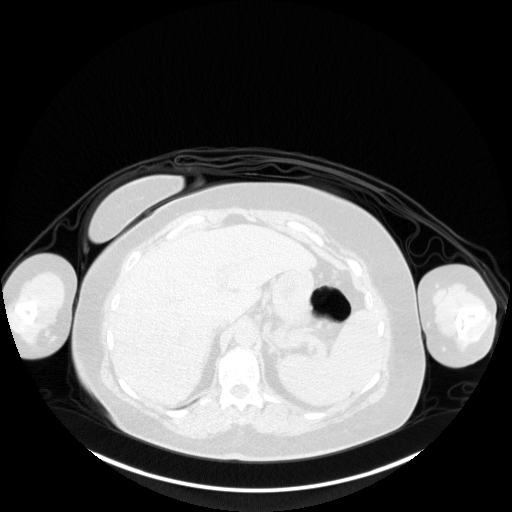

[Series 606: fused tra · 7 of 438 slices shown]
[im 1/438]
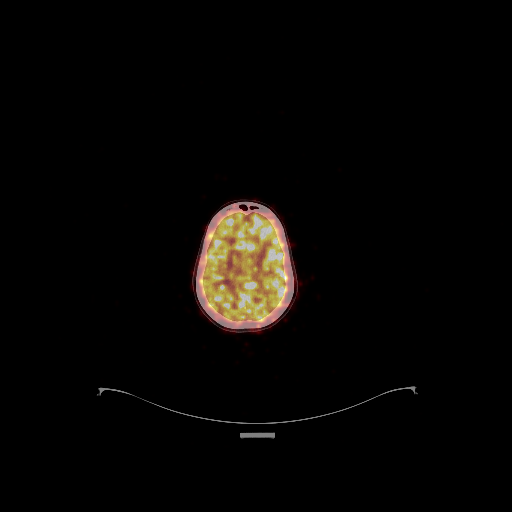
[im 73/438]
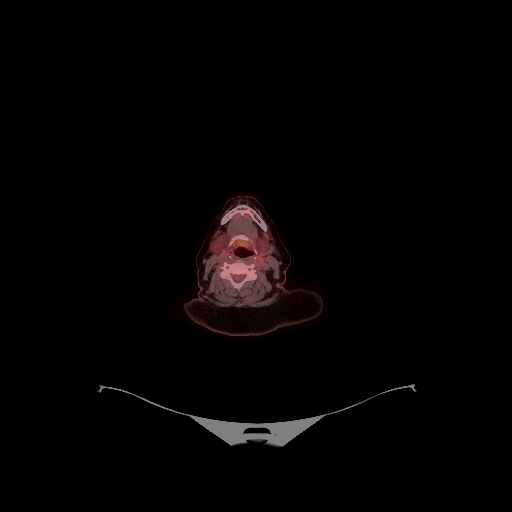
[im 146/438]
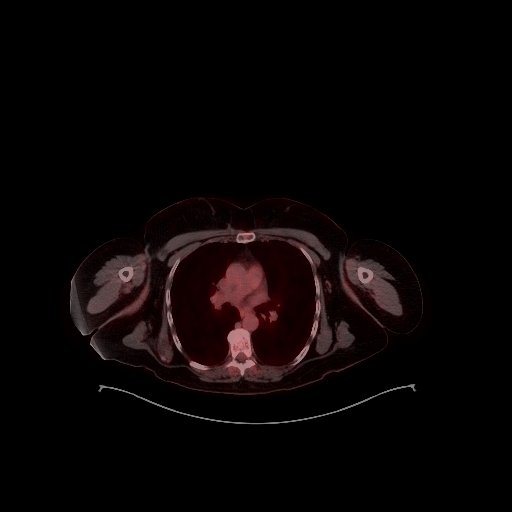
[im 219/438]
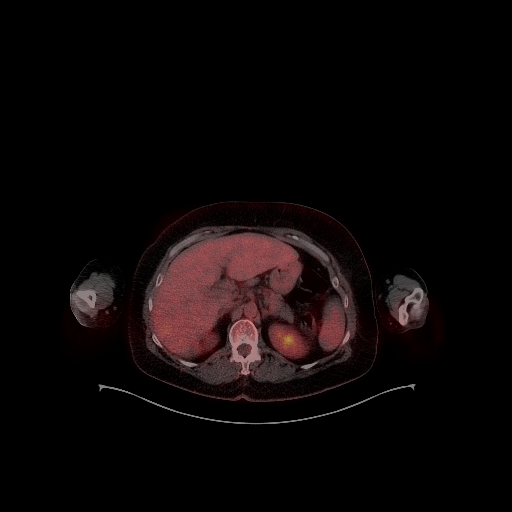
[im 292/438]
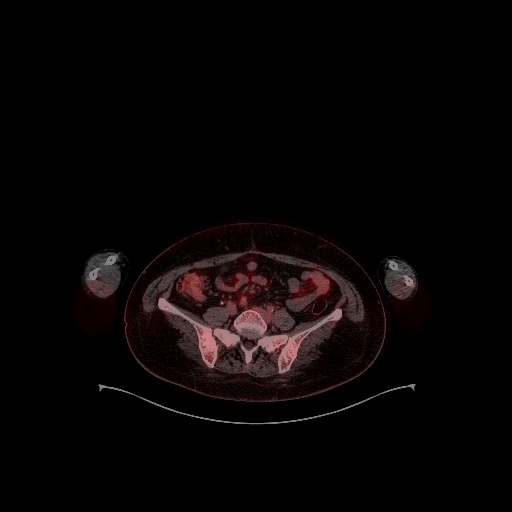
[im 365/438]
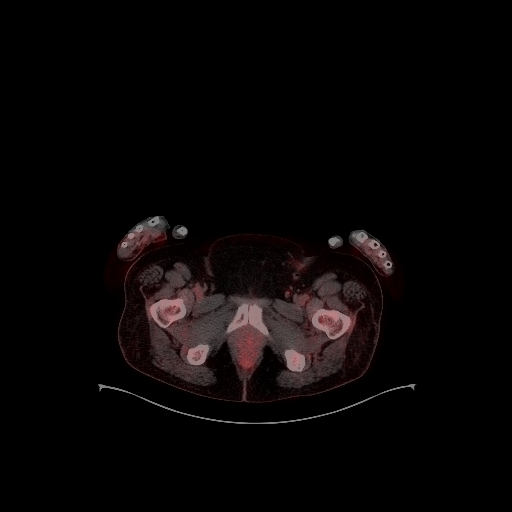
[im 438/438]
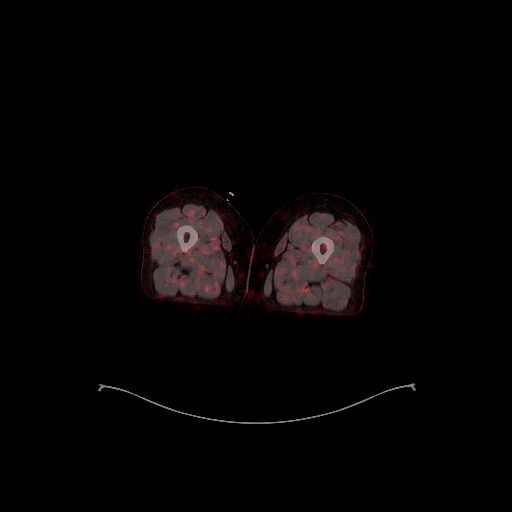

[Series 608: fused cor · 2 of 133 slices shown]
[im 1/133]
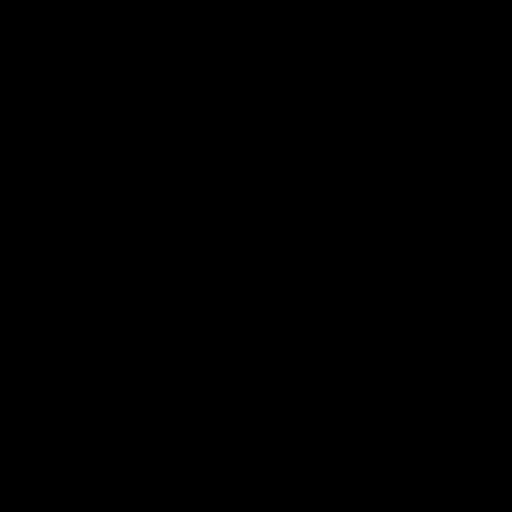
[im 133/133]
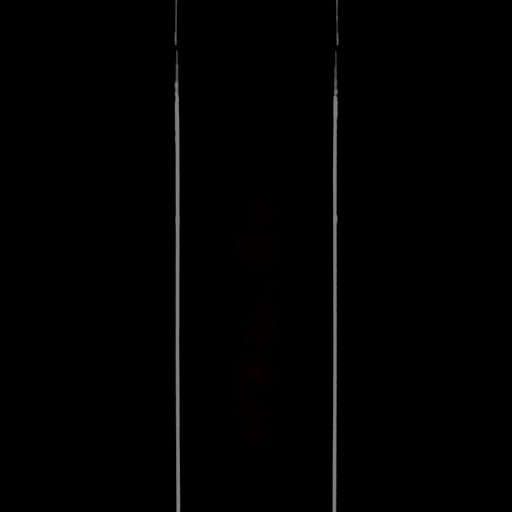

[Series 609: mip cine · coronal · 1.82mm/px · 1 of 48 slices shown]
[im 1/48]
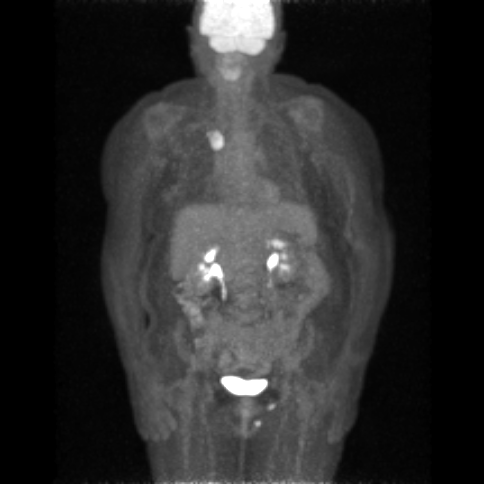

[25 of 25 positions shown; findings below may reference images not displayed]

FINDINGS: Mediastinal blood pool activity: SUV max

Liver activity: SUV max NA

NECK: No hypermetabolic lymph nodes in the neck.

Incidental CT findings: none

CHEST:

Hypermetabolic solid 3.1 x 2.1 cm posterior right upper lobe lung
mass with max SUV 8.9 (series 8/image 29), with heterogeneous faint
internal calcification. No additional hypermetabolic pulmonary
findings.

No enlarged or hypermetabolic hilar, mediastinal or axillary lymph
nodes.

Incidental CT findings: Mildly atherosclerotic nonaneurysmal
thoracic aorta. Mild paraseptal and centrilobular emphysema.

ABDOMEN/PELVIS: No abnormal hypermetabolic activity within the
liver, pancreas, adrenal glands, or spleen. No hypermetabolic lymph
nodes in the abdomen or pelvis.

Two small superficial foci of hypermetabolism in the left inguinal
region and left perineum, without CT correlate, nonspecific, favor
cutaneous contamination.

Incidental CT findings: Cholecystectomy. Finely irregular liver
surface, cannot exclude cirrhosis.

SKELETON: No focal hypermetabolic activity to suggest skeletal
metastasis.

Incidental CT findings: none
IMPRESSION: 1. Hypermetabolic solid 3.1 cm posterior right upper lobe lung mass,
compatible with primary bronchogenic malignancy.
2. No hypermetabolic thoracic adenopathy or distant metastatic
disease.
3. Finely irregular liver surface, cannot exclude cirrhosis. Suggest
correlation with liver function tests.
4. Aortic Atherosclerosis (OF0I0-GQB.B) and Emphysema ([id number].W).

## 2023-03-31 ENCOUNTER — Encounter: Payer: Self-pay | Admitting: Gastroenterology

## 2023-03-31 ENCOUNTER — Ambulatory Visit: Payer: Medicaid Other | Admitting: Gastroenterology

## 2023-03-31 VITALS — BP 131/83 | HR 88 | Temp 97.9°F | Ht 61.0 in | Wt 151.0 lb

## 2023-03-31 DIAGNOSIS — R1310 Dysphagia, unspecified: Secondary | ICD-10-CM | POA: Insufficient documentation

## 2023-03-31 DIAGNOSIS — K219 Gastro-esophageal reflux disease without esophagitis: Secondary | ICD-10-CM

## 2023-03-31 DIAGNOSIS — A09 Infectious gastroenteritis and colitis, unspecified: Secondary | ICD-10-CM | POA: Diagnosis not present

## 2023-03-31 DIAGNOSIS — R7989 Other specified abnormal findings of blood chemistry: Secondary | ICD-10-CM

## 2023-03-31 DIAGNOSIS — K760 Fatty (change of) liver, not elsewhere classified: Secondary | ICD-10-CM

## 2023-03-31 DIAGNOSIS — R932 Abnormal findings on diagnostic imaging of liver and biliary tract: Secondary | ICD-10-CM | POA: Diagnosis not present

## 2023-03-31 DIAGNOSIS — R1319 Other dysphagia: Secondary | ICD-10-CM

## 2023-03-31 NOTE — Patient Instructions (Signed)
Please complete labs and stool studies as discussed today.  We will be in touch within 5 business days after results are available. Complete ultrasound. Continue dicyclomine 20 mg up to 3 times daily as needed for abdominal cramping and loose stools. Continue Nexium 40 mg daily before breakfast. Consider colonoscopy and upper endoscopy after initial workup of liver completed.

## 2023-03-31 NOTE — Progress Notes (Signed)
GI Office Note    Referring Provider: Donetta Potts, MD Primary Care Physician:  Donetta Potts, MD  Primary Gastroenterologist: Roetta Sessions, MD   Chief Complaint   Chief Complaint  Patient presents with   Cirrhosis    States that every time she eat she has diarrhea, has been on dicyclomine for around 6 mths.      History of Present Illness   [name] [name] is a 52 y.o. female presenting today for further evaluation of cirrhosis at the request of Lianne Moris PA-C.   She has h/o stage 1A3 small cell lung cancer treated with right VATS upper lobectomy and mediastinal lymph node dissection in 12/2021. She received only day 1 of adjuvant chemotherapy with Cisplatin VP-16 03/02/22 due to severe GI toxicity. She declined see RT to discuss PCI. Under surveillance currently.    Recently completed Chest CT for surveillance and noted to have concern for cirrhosis. Labs in 2023, negative for Hep B. Not immune to hepatitis B. Liver biopsy 2014 c/w steatohepatitis.  Biopsy at that time with NAS 5, fibrosis staging 1A.  States she was diagnosed with diabetes about 5 years ago.  Recently her 33 year old son was diagnosed with fatty liver, he is mildly overweight.  No family history of cirrhosis.  She has never been a heavy drinker.  She drinks 1 to 2 glasses of wine per month.  Some months drinks none.  History of IV or intranasal drug use.  She has multiple tattoos.  Having diarrhea for the past 6 months, often urgent and postprandial.  Associated with abdominal cramping.  Typically 3-4 small stools daily.  No melena or rectal bleeding.  Takes dicyclomine 20 mg 3 times a day as needed but does not take very often.  Heartburn controlled on Nexium 40 mg daily.  Has been on acid reflux medication for years.  Complains of solid food dysphagia especially to meat.  Chronically has had elevated LFTs.  Labs 03/24/23: Cre 0.58, Na 141, Alb 4.1, Tbili 0.5, AP 183H, AST 68H, ALT 33H, WBC 12.1H,  Hgb 14.5, Platelets 216  Labs 02/2023: White blood cell count 10,400, hemoglobin 15, platelets 213,000, sodium 137, potassium 3.8, creatinine 0.8, glucose 367, total bilirubin 0.7, AST 81, ALT 62, alkaline phosphatase 215, LDH 189  Labs 08/2022: Total bilirubin 0.3, AST 24, ALT 23, alkaline phosphatase 196  Chest CT 02/15/23: 1. Postsurgical changes of right upper lobectomy with scarring and  volume loss.  2. No CT evidence of recurrent or metastatic disease in the chest.  3. Emphysema.  4. Aortic and coronary artery atherosclerosis.  5. Hepatic steatosis with capsular nodular surface changes c/w cirrhosis and mild prominence of the portal vein measuring 15mm.  6. Osteopenia, degenerative and chronic changes of the thoracic  spine with kyphosis and mild chronic wedging of the T7, T8, T9  vertebral bodies.   CT A/P with contrast 03/06/22: normal liver/spleen. No acute intra-abdominal or pelvic finding by CT  Liver bx 2014: steatohepatitis, NAS 5, Fibrosis staging 1A  EUS 03/2012: hepatic steatosis. Fatty pancreas. Duodenal bx, benign  EGD and colonoscopy in 2006 with small sliding hiatal hernia, small internal hemorrhoids, small polyp in the transverse colon removed.  Pathology unavailable.  Medications   Current Outpatient Medications  Medication Sig Dispense Refill   albuterol (VENTOLIN HFA) 108 (90 Base) MCG/ACT inhaler Inhale into the lungs.     ALPRAZolam (XANAX) 1 MG tablet Take 1 mg by mouth at bedtime as needed for anxiety  or sleep.     atorvastatin (LIPITOR) 20 MG tablet Take 1 tablet (20 mg total) daily by mouth. 90 tablet 3   cyclobenzaprine (FLEXERIL) 10 MG tablet Take 10 mg by mouth at bedtime.     dicyclomine (BENTYL) 20 MG tablet Take 20 mg by mouth 3 (three) times daily.     esomeprazole (NEXIUM) 40 MG capsule Take 40 mg by mouth daily before breakfast.     flunisolide (AEROBID) 250 MCG/ACT inhaler Inhale 2 puffs into the lungs 2 (two) times daily.     ibuprofen  (ADVIL,MOTRIN) 800 MG tablet Take 1 tablet (800 mg total) by mouth 3 (three) times daily. (Patient taking differently: Take 800 mg by mouth every 8 (eight) hours as needed for mild pain or moderate pain.) 21 tablet 0   sitaGLIPtin (JANUVIA) 100 MG tablet Take 100 mg by mouth daily.      Vilazodone HCl (VIIBRYD) 40 MG TABS Take 40 mg by mouth daily.     No current facility-administered medications for this visit.    Allergies   Allergies as of 03/31/2023 - Review Complete 03/31/2023  Allergen Reaction Noted   Codeine Nausea And Vomiting 01/30/2013   Sulfa antibiotics Nausea And Vomiting 01/10/2013    Past Medical History   Past Medical History:  Diagnosis Date   Anemia    Anxiety    Asthma    COPD (chronic obstructive pulmonary disease) (HCC)    Depression    Diabetes mellitus without complication (HCC)    Fatty liver    GERD (gastroesophageal reflux disease)    History of internal jugular thrombosis    related to port a cath   Hx of gonorrhea    Hyperlipidemia    Osteopenia    Shortness of breath     Past Surgical History   Past Surgical History:  Procedure Laterality Date   cesarean section x 3     CHOLECYSTECTOMY     DILITATION & CURRETTAGE/HYSTROSCOPY WITH THERMACHOICE ABLATION N/A 02/28/2013   Procedure: DILATATION & CURETTAGE/HYSTEROSCOPY WITH THERMACHOICE ABLATION;  Surgeon: Tilda Burrow, MD;  Location: [facility];  Service: Gynecology;  Laterality: N/A;  Total Ablation Therapy Time = 9 minutes 31 seconds   EUS  04/06/2012   Procedure: ESOPHAGEAL ENDOSCOPIC ULTRASOUND (EUS) RADIAL;  Surgeon: Willis Modena, MD;  Location: [facility];  Service: Endoscopy;  Laterality: N/A;   LUNG LOBECTOMY Right 12/2021   TONSILLECTOMY     TUBAL LIGATION      Past Family History   Family History  Problem Relation Age of Onset   Cancer Father 64       lung. liver. lymph nodes, at time of diagnosis metastatic, agent orange. not sure of primary   Spina bifida Brother     Pancreatic cancer Paternal Grandmother    Colon cancer Neg Hx     Past Social History   Social History   Socioeconomic History   Marital status: Single    Spouse name: Not on file   Number of children: Not on file   Years of education: Not on file   Highest education level: Not on file  Occupational History   Not on file  Tobacco Use   Smoking status: Every Day    Current packs/day: 1.50    Average packs/day: 2.0 packs/day for 38.6 years (75.5 ttl pk-yrs)    Types: Cigarettes    Start date: 12   Smokeless tobacco: Never   Tobacco comments:    never done snuff or chewing  tobacco.  Vaping Use   Vaping status: Never Used  Substance and Sexual Activity   Alcohol use: No    Comment: 1-2 glasses of wine per month at the most. Never heavy use   Drug use: Yes    Types: Marijuana   Sexual activity: Not Currently  Other Topics Concern   Not on file  Social History Narrative   Not on file   Social Determinants of Health   Financial Resource Strain: Low Risk  (10/23/2021)   Received from [facility]   Overall Financial Resource Strain (CARDIA)    Difficulty of Paying Living Expenses: Not hard at all  Food Insecurity: No Food Insecurity (10/23/2021)   Received from [facility]   Hunger Vital Sign    Worried About Running Out of Food in the Last Year: Never true    Ran Out of Food in the Last Year: Never true  Transportation Needs: No Transportation Needs (10/23/2021)   Received from [facility]   PRAPARE - Transportation    Lack of Transportation (Medical): No    Lack of Transportation (Non-Medical): No  Physical Activity: Inactive (10/23/2021)   Received from [facility]   Exercise Vital Sign    Days of Exercise per Week: 0 days    Minutes of Exercise per Session: 0 min  Stress: No Stress Concern Present (10/23/2021)   Received from [facility] - Occupational Stress Questionnaire    Feeling of Stress : Not  at all  Social Connections: Moderately Isolated (10/23/2021)   Received from [facility]   Social Connection and Isolation Panel [NHANES]    Frequency of Communication with Friends and Family: More than three times a week    Frequency of Social Gatherings with Friends and Family: Three times a week    Attends Religious Services: Never    Active Member of Clubs or Organizations: No    Attends Banker Meetings: Never    Marital Status: Living with partner  Intimate Partner Violence: Not At Risk (10/23/2021)   Received from [facility]   Humiliation, Afraid, Rape, and Kick questionnaire    Fear of Current or Ex-Partner: No    Emotionally Abused: No    Physically Abused: No    Sexually Abused: No    Review of Systems   General: Negative for anorexia, weight loss, fever, chills, fatigue, weakness. Eyes: Negative for vision changes.  ENT: Negative for hoarseness, difficulty swallowing , nasal congestion. CV: Negative for chest pain, angina, palpitations, dyspnea on exertion, peripheral edema.  Respiratory: Negative for dyspnea at rest, dyspnea on exertion, cough, sputum, wheezing.  GI: See history of present illness. GU:  Negative for dysuria, hematuria, urinary incontinence, urinary frequency, nocturnal urination.  MS: Negative for joint pain, low back pain.  Derm: Negative for rash or itching.  Neuro: Negative for weakness, abnormal sensation, seizure, frequent headaches, memory loss,  confusion.  Psych: Negative for anxiety, depression, suicidal ideation, hallucinations.  Endo: Negative for unusual weight change.  Heme: Negative for bruising or bleeding. Allergy: Negative for rash or hives.  Physical Exam   BP 131/83 (BP Location: Right Arm, Patient Position: Sitting, Cuff Size: Normal)   Pulse 88   Temp 97.9 F (36.6 C) (Oral)   Ht 5\' 1"  (1.549 m)   Wt 151 lb (68.5 kg)   LMP  (LMP Unknown)   SpO2 97%   BMI 28.53 kg/m    General:  Well-nourished,  well-developed in no acute distress.  Head: Normocephalic, atraumatic.   Eyes: Conjunctiva pink, no icterus. Mouth: Oropharyngeal mucosa moist and pink  Neck: Supple without thyromegaly, masses, or lymphadenopathy.  Lungs: Clear to auscultation bilaterally.  Heart: Regular rate and rhythm, no murmurs rubs or gallops.  Abdomen: Bowel sounds are normal, nontender, nondistended, no hepatosplenomegaly or masses,  no abdominal bruits or hernia, no rebound or guarding.   Rectal: not performed Extremities: No lower extremity edema. No clubbing or deformities.  Neuro: Alert and oriented x 4 , grossly normal neurologically.  Skin: Warm and dry, no rash or jaundice.   Psych: Alert and cooperative, normal mood and affect.  Labs   See hpi  Imaging Studies   No results found.  Assessment   *Abnormal CT of liver/?cirrhosis/history of bx proven steatohepatitis *Elevated LFTs *GERD *Esophageal dysphagia *Diarrhea  Recent imaging of liver concerning for cirrhosis with nodular border and prominent portal vein.  Liver biopsy 2014 c/w steatohepatitis.  Biopsy at that time with NAS 5, fibrosis staging 1A.  Chronically elevated LFTs. Recommend serologies to rule out viral hepatitis, autoimmune process, celiac, iron overload. Further complete imaging of liver and spleen needed.   Chronic GERD for years, typical symptoms controlled. Complains of solid food dysphagia which could be due to complicated GERD, esophageal stricture/ring, EOE.   Diarrhea for six months. Check celiac serologies. Rule out infectious etiology. Consider colonoscopy in the near future.    PLAN   Continue dicyclomine 20mg  up to TID prn. Continue Nexium 40mg  daily before breakfast. U/S Abd with elastography. GI profile. Labs.  Consider colonoscopy/EGD/ED after initial work up of liver completed.    Leanna Battles. Melvyn Neth, MHS, PA-C [facility]

## 2023-04-01 ENCOUNTER — Encounter: Payer: Self-pay | Admitting: Gastroenterology

## 2023-04-04 LAB — CBC WITH DIFFERENTIAL/PLATELET
Basophils Absolute: 0.1 10*3/uL (ref 0.0–0.2)
Basos: 1 %
EOS (ABSOLUTE): 0.2 10*3/uL (ref 0.0–0.4)
Eos: 2 %
Hematocrit: 44.6 % (ref 34.0–46.6)
Hemoglobin: 15.2 g/dL (ref 11.1–15.9)
Immature Grans (Abs): 0 10*3/uL (ref 0.0–0.1)
Immature Granulocytes: 0 %
Lymphocytes Absolute: 4.4 10*3/uL — ABNORMAL HIGH (ref 0.7–3.1)
Lymphs: 36 %
MCH: 30.6 pg (ref 26.6–33.0)
MCHC: 34.1 g/dL (ref 31.5–35.7)
MCV: 90 fL (ref 79–97)
Monocytes Absolute: 0.6 10*3/uL (ref 0.1–0.9)
Monocytes: 5 %
Neutrophils Absolute: 6.8 10*3/uL (ref 1.4–7.0)
Neutrophils: 56 %
Platelets: 224 10*3/uL (ref 150–450)
RBC: 4.97 x10E6/uL (ref 3.77–5.28)
RDW: 12.9 % (ref 11.7–15.4)
WBC: 12.2 10*3/uL — ABNORMAL HIGH (ref 3.4–10.8)

## 2023-04-04 LAB — COMPREHENSIVE METABOLIC PANEL
ALT: 32 IU/L (ref 0–32)
AST: 55 IU/L — ABNORMAL HIGH (ref 0–40)
Albumin: 4.2 g/dL (ref 3.8–4.9)
Alkaline Phosphatase: 202 IU/L — ABNORMAL HIGH (ref 44–121)
BUN/Creatinine Ratio: 7 — ABNORMAL LOW (ref 9–23)
BUN: 4 mg/dL — ABNORMAL LOW (ref 6–24)
Bilirubin Total: 0.5 mg/dL (ref 0.0–1.2)
CO2: 24 mmol/L (ref 20–29)
Calcium: 9.9 mg/dL (ref 8.7–10.2)
Chloride: 103 mmol/L (ref 96–106)
Creatinine, Ser: 0.58 mg/dL (ref 0.57–1.00)
Globulin, Total: 3.1 g/dL (ref 1.5–4.5)
Glucose: 139 mg/dL — ABNORMAL HIGH (ref 70–99)
Potassium: 3.9 mmol/L (ref 3.5–5.2)
Sodium: 142 mmol/L (ref 134–144)
Total Protein: 7.3 g/dL (ref 6.0–8.5)
eGFR: 108 mL/min/{1.73_m2} (ref >59)

## 2023-04-04 LAB — IRON,TIBC AND FERRITIN PANEL
Ferritin: 103 ng/mL (ref 15–150)
Iron Saturation: 30 % (ref 15–55)
Iron: 96 ug/dL (ref 27–159)
Total Iron Binding Capacity: 325 ug/dL (ref 250–450)
UIBC: 229 ug/dL (ref 131–425)

## 2023-04-04 LAB — ANA: Anti Nuclear Antibody (ANA): POSITIVE — AB

## 2023-04-04 LAB — HEPATITIS C ANTIBODY: Hep C Virus Ab: NONREACTIVE

## 2023-04-04 LAB — IGG, IGA, IGM
IgA/Immunoglobulin A, Serum: 357 mg/dL — ABNORMAL HIGH (ref 87–352)
IgG (Immunoglobin G), Serum: 1065 mg/dL (ref 586–1602)
IgM (Immunoglobulin M), Srm: 190 mg/dL (ref 26–217)

## 2023-04-04 LAB — MITOCHONDRIAL/SMOOTH MUSCLE AB PNL
Mitochondrial Ab: <20 U (ref 0.0–20.0)
Smooth Muscle Ab: 15 U (ref 0–19)

## 2023-04-04 LAB — ALPHA-1-ANTITRYPSIN: A-1 Antitrypsin: 182 mg/dL (ref 101–187)

## 2023-04-04 LAB — PROTIME-INR
INR: 1 (ref 0.9–1.2)
Prothrombin Time: 11.6 s (ref 9.1–12.0)

## 2023-04-04 LAB — HEPATITIS A ANTIBODY, TOTAL: hep A Total Ab: NEGATIVE

## 2023-04-04 LAB — AFP TUMOR MARKER: AFP, Serum, Tumor Marker: 5.1 ng/mL (ref 0.0–9.2)

## 2023-04-04 LAB — HEPATITIS B CORE ANTIBODY, TOTAL: Hep B Core Total Ab: NEGATIVE

## 2023-04-04 LAB — TISSUE TRANSGLUTAMINASE, IGA: Transglutaminase IgA: <2 U/mL (ref 0–3)

## 2023-04-04 LAB — CERULOPLASMIN: Ceruloplasmin: 25.8 mg/dL (ref 19.0–39.0)

## 2023-04-05 ENCOUNTER — Other Ambulatory Visit: Payer: Self-pay | Admitting: *Deleted

## 2023-04-05 DIAGNOSIS — R768 Other specified abnormal immunological findings in serum: Secondary | ICD-10-CM

## 2023-04-06 ENCOUNTER — Ambulatory Visit (HOSPITAL_COMMUNITY): Payer: Medicaid Other

## 2023-04-07 ENCOUNTER — Ambulatory Visit (HOSPITAL_COMMUNITY)
Admission: RE | Admit: 2023-04-07 | Discharge: 2023-04-07 | Disposition: A | Discharge status: Home / Self Care | Payer: Medicaid Other | Source: Ambulatory Visit | Attending: Gastroenterology | Admitting: Gastroenterology

## 2023-04-07 DIAGNOSIS — A09 Infectious gastroenteritis and colitis, unspecified: Secondary | ICD-10-CM | POA: Diagnosis present

## 2023-04-07 DIAGNOSIS — K219 Gastro-esophageal reflux disease without esophagitis: Secondary | ICD-10-CM | POA: Diagnosis present

## 2023-04-07 DIAGNOSIS — R7989 Other specified abnormal findings of blood chemistry: Secondary | ICD-10-CM | POA: Diagnosis present

## 2023-04-07 DIAGNOSIS — K760 Fatty (change of) liver, not elsewhere classified: Secondary | ICD-10-CM | POA: Diagnosis present

## 2023-04-07 DIAGNOSIS — R932 Abnormal findings on diagnostic imaging of liver and biliary tract: Secondary | ICD-10-CM | POA: Insufficient documentation

## 2023-09-21 NOTE — Progress Notes (Deleted)
 Office Visit Note  Patient: [name] [name]             Date of Birth: 30-Jan-1971           MRN: [id number]             PCP: [name] Potts, MD Referring: De Blanch Visit Date: 09/22/2023 Occupation: @GUAROCC @  Subjective:  No chief complaint on file.   History of Present Illness: [name] [name] is a 53 y.o. female ***      Assessment/Plan: [name] is a 53 year old lady with a history of a Stage IA3 small cell lung cancer treated with right VATS upper lobectomy and mediastinal lymph node dissection in May 2023.  She received only day 1 of adjuvant chemotherapy with Cisplatin VP-16 on 03/02/2022. She quit due to severe GI toxicity.  She declined seeing RT to discuss about PCI.  She has been under surveillance.  Cancer Staging  Small cell lung cancer (CMS-HCC) Staging form: Lung, AJCC 8th Edition - Pathologic stage from 12/19/2021: Stage IA3 (pT1c, pN0, cM0) - Signed by Alda Berthold, MD on 02/13/2022  ***  Activities of Daily Living:  Patient reports morning stiffness for *** {minute/hour:19697}.   Patient {ACTIONS;DENIES/REPORTS:21021675::"Denies"} nocturnal pain.  Difficulty dressing/grooming: {ACTIONS;DENIES/REPORTS:21021675::"Denies"} Difficulty climbing stairs: {ACTIONS;DENIES/REPORTS:21021675::"Denies"} Difficulty getting out of chair: {ACTIONS;DENIES/REPORTS:21021675::"Denies"} Difficulty using hands for taps, buttons, cutlery, and/or writing: {ACTIONS;DENIES/REPORTS:21021675::"Denies"}  No Rheumatology ROS completed.   PMFS History:  Patient Active Problem List   Diagnosis Date Noted   Dysphagia 03/31/2023   Current smoker 05/06/2017   Multinodular goiter 05/06/2017   Uncontrolled type 2 diabetes mellitus with complication, without long-term current use of insulin 04/22/2017   Class 1 obesity due to excess calories with serious comorbidity and body mass index (BMI) of 34.0 to 34.9 in adult 04/22/2017   Mixed hyperlipidemia 04/22/2017    Leukocytosis 09/09/2016   Depression 01/10/2013   Reflux 01/10/2013    Past Medical History:  Diagnosis Date   Anemia    Anxiety    Asthma    COPD (chronic obstructive pulmonary disease) (HCC)    Depression    Diabetes mellitus without complication (HCC)    Fatty liver    GERD (gastroesophageal reflux disease)    History of internal jugular thrombosis    related to port a cath   Hx of gonorrhea    Hyperlipidemia    Osteopenia    Shortness of breath     Family History  Problem Relation Age of Onset   Cancer Father 65       lung. liver. lymph nodes, at time of diagnosis metastatic, agent orange. not sure of primary   Spina bifida Brother    Pancreatic cancer Paternal Grandmother    Colon cancer Neg Hx    Past Surgical History:  Procedure Laterality Date   cesarean section x 3     CHOLECYSTECTOMY     DILITATION & CURRETTAGE/HYSTROSCOPY WITH THERMACHOICE ABLATION N/A 02/28/2013   Procedure: DILATATION & CURETTAGE/HYSTEROSCOPY WITH THERMACHOICE ABLATION;  Surgeon: Tilda Burrow, MD;  Location: [facility];  Service: Gynecology;  Laterality: N/A;  Total Ablation Therapy Time = 9 minutes 31 seconds   EUS  04/06/2012   Procedure: ESOPHAGEAL ENDOSCOPIC ULTRASOUND (EUS) RADIAL;  Surgeon: Willis Modena, MD;  Location: [facility];  Service: Endoscopy;  Laterality: N/A;   LUNG LOBECTOMY Right 12/2021   TONSILLECTOMY     TUBAL LIGATION     Social History   Social History Narrative   Not  on file    There is no immunization history on file for this patient.   Objective: Vital Signs: There were no vitals taken for this visit.   Physical Exam   Musculoskeletal Exam: ***  CDAI Exam: CDAI Score: -- Patient Global: --; Provider Global: -- Swollen: --; Tender: -- Joint Exam 09/22/2023   No joint exam has been documented for this visit   There is currently no information documented on the homunculus. Go to the Rheumatology activity and complete the homunculus joint  exam.  Investigation: No additional findings.  Imaging: No results found.  Recent Labs: Lab Results  Component Value Date   WBC 12.2 (H) 03/31/2023   HGB 15.2 03/31/2023   PLT 224 03/31/2023   NA 142 03/31/2023   K 3.9 03/31/2023   CL 103 03/31/2023   CO2 24 03/31/2023   GLUCOSE 139 (H) 03/31/2023   BUN 4 (L) 03/31/2023   CREATININE 0.58 03/31/2023   BILITOT 0.5 03/31/2023   ALKPHOS 202 (H) 03/31/2023   AST 55 (H) 03/31/2023   ALT 32 03/31/2023   PROT 7.3 03/31/2023   ALBUMIN 4.2 03/31/2023   CALCIUM 9.9 03/31/2023   GFRAA >60 02/22/2020    Speciality Comments: No specialty comments available.  Procedures:  No procedures performed Allergies: Codeine and Sulfa antibiotics   Assessment / Plan:     Visit Diagnoses: No diagnosis found.  Orders: No orders of the defined types were placed in this encounter.  No orders of the defined types were placed in this encounter.   Face-to-face time spent with patient was *** minutes. Greater than 50% of time was spent in counseling and coordination of care.  Follow-Up Instructions: No follow-ups on file.   Fuller Plan, MD  Note - This record has been created using [employer].  Chart creation errors have been sought, but may not always  have been located. Such creation errors do not reflect on  the standard of medical care.

## 2023-09-22 ENCOUNTER — Telehealth: Payer: Self-pay | Admitting: Internal Medicine

## 2023-09-22 ENCOUNTER — Encounter: Payer: Medicaid Other | Admitting: Internal Medicine

## 2023-09-22 NOTE — Telephone Encounter (Signed)
Unable to reschedule. Patient no showed new patient appointment instead of call and cancelling her appointment to reschedule.

## 2023-09-22 NOTE — Telephone Encounter (Signed)
Pt called asking if we could reschedule her npt appt. Pt no showed npt appt. Advised pt I would ask the doctor due to [place] having a no show policy. Pt understood.

## 2023-10-05 ENCOUNTER — Encounter: Payer: Self-pay | Admitting: Internal Medicine

## 2024-01-26 ENCOUNTER — Ambulatory Visit: Admitting: Gastroenterology

## 2024-01-26 NOTE — Progress Notes (Deleted)
 GI Office Note    Referring Provider: Lauran Pollard, MD Primary Care Physician:  Lauran Pollard, MD  Primary Gastroenterologist: Rheba Cedar, MD   Chief Complaint   No chief complaint on file.   History of Present Illness   [name] [name] is a 53 y.o. female presenting today   For follow up. Last seen 03/2023 for concern for cirrhosis based on chest CT findings.   She has h/o stage 1A3 small cell lung cancer treated with right VATS upper lobectomy and mediastinal lymph node dissection in 12/2021. She received only day 1 of adjuvant chemotherapy with Cisplatin VP-16 03/02/22 due to severe GI toxicity. She declined see RT to discuss PCI. Under surveillance currently.    Labs in 2023, negative for Hep B. Not immune to hepatitis B. Liver biopsy 2014 c/w steatohepatitis.  Biopsy at that time with NAS 5, fibrosis staging 1A.  States she was diagnosed with diabetes about 5 years ago.  Recently her 29 year old son was diagnosed with fatty liver, he is mildly overweight.  No family history of cirrhosis.  She has never been a heavy drinker.  She drinks 1 to 2 glasses of wine per month.  Some months drinks none.  History of IV or intranasal drug use.  She has multiple tattoos.     She declined EGD/TCS offered 04/2023  Labs 03/2023:***  Labs 01/2024:***  Abd u/s with elastography 03/2023: prior cholecystectomy. Spleen normal in size. Liver unremarkable. Median kPa: 4.7. IQR/Median kPa ratio of 0.47.     EUS 03/2012: hepatic steatosis. Fatty pancreas. Duodenal bx, benign   EGD and colonoscopy in 2006 with small sliding hiatal hernia, small internal hemorrhoids, small polyp in the transverse colon removed.  Pathology unavailable.    Medications   Current Outpatient Medications  Medication Sig Dispense Refill   albuterol (VENTOLIN HFA) 108 (90 Base) MCG/ACT inhaler Inhale into the lungs.     ALPRAZolam (XANAX) 1 MG tablet Take 1 mg by mouth at bedtime as needed for anxiety  or sleep.     atorvastatin  (LIPITOR) 20 MG tablet Take 1 tablet (20 mg total) daily by mouth. 90 tablet 3   cyclobenzaprine (FLEXERIL) 10 MG tablet Take 10 mg by mouth at bedtime.     dicyclomine (BENTYL) 20 MG tablet Take 20 mg by mouth 3 (three) times daily.     esomeprazole (NEXIUM) 40 MG capsule Take 40 mg by mouth daily before breakfast.     flunisolide (AEROBID) 250 MCG/ACT inhaler Inhale 2 puffs into the lungs 2 (two) times daily.     ibuprofen  (ADVIL ,MOTRIN ) 800 MG tablet Take 1 tablet (800 mg total) by mouth 3 (three) times daily. (Patient taking differently: Take 800 mg by mouth every 8 (eight) hours as needed for mild pain or moderate pain.) 21 tablet 0   sitaGLIPtin (JANUVIA) 100 MG tablet Take 100 mg by mouth daily.      Vilazodone HCl (VIIBRYD) 40 MG TABS Take 40 mg by mouth daily.     No current facility-administered medications for this visit.    Allergies   Allergies as of 01/26/2024 - Review Complete 03/31/2023  Allergen Reaction Noted   Codeine Nausea And Vomiting 01/30/2013   Sulfa  antibiotics Nausea And Vomiting 01/10/2013     Past Medical History   Past Medical History:  Diagnosis Date   Anemia    Anxiety    Asthma    COPD (chronic obstructive pulmonary disease) (HCC)    Depression  Diabetes mellitus without complication (HCC)    Fatty liver    GERD (gastroesophageal reflux disease)    History of internal jugular thrombosis    related to port a cath   Hx of gonorrhea    Hyperlipidemia    Osteopenia    Shortness of breath     Past Surgical History   Past Surgical History:  Procedure Laterality Date   cesarean section x 3     CHOLECYSTECTOMY     DILITATION & CURRETTAGE/HYSTROSCOPY WITH THERMACHOICE ABLATION N/A 02/28/2013   Procedure: DILATATION & CURETTAGE/HYSTEROSCOPY WITH THERMACHOICE ABLATION;  Surgeon: Albino Hum, MD;  Location: [facility];  Service: Gynecology;  Laterality: N/A;  Total Ablation Therapy Time = 9 minutes 31 seconds   EUS   04/06/2012   Procedure: ESOPHAGEAL ENDOSCOPIC ULTRASOUND (EUS) RADIAL;  Surgeon: Evangeline Hilts, MD;  Location: [facility];  Service: Endoscopy;  Laterality: N/A;   LUNG LOBECTOMY Right 12/2021   TONSILLECTOMY     TUBAL LIGATION      Past Family History   Family History  Problem Relation Age of Onset   Cancer Father 58       lung. liver. lymph nodes, at time of diagnosis metastatic, agent orange. not sure of primary   Spina bifida Brother    Pancreatic cancer Paternal Grandmother    Colon cancer Neg Hx     Past Social History   Social History   Socioeconomic History   Marital status: Single    Spouse name: Not on file   Number of children: Not on file   Years of education: Not on file   Highest education level: Not on file  Occupational History   Not on file  Tobacco Use   Smoking status: Every Day    Current packs/day: 1.50    Average packs/day: 1.9 packs/day for 39.5 years (76.7 ttl pk-yrs)    Types: Cigarettes    Start date: 95   Smokeless tobacco: Never   Tobacco comments:    never done snuff or chewing tobacco.  Vaping Use   Vaping status: Never Used  Substance and Sexual Activity   Alcohol use: No    Comment: 1-2 glasses of wine per month at the most. Never heavy use   Drug use: Yes    Types: Marijuana   Sexual activity: Not Currently  Other Topics Concern   Not on file  Social History Narrative   Not on file   Social Drivers of Health   Financial Resource Strain: Low Risk  (10/23/2021)   Received from [facility]   Overall Financial Resource Strain (CARDIA)    Difficulty of Paying Living Expenses: Not hard at all  Food Insecurity: No Food Insecurity (10/23/2021)   Received from [facility]   Hunger Vital Sign    Within the past 12 months, you worried that your food would run out before you got the money to buy more.: Never true    Within the past 12 months, the food you bought just didn't last and you didn't have money to get more.: Never  true  Transportation Needs: No Transportation Needs (10/23/2021)   Received from [facility]   PRAPARE - Transportation    Lack of Transportation (Medical): No    Lack of Transportation (Non-Medical): No  Physical Activity: Inactive (10/23/2021)   Received from [facility]   Exercise Vital Sign    On average, how many days per week do you engage in moderate to strenuous  exercise (like a brisk walk)?: 0 days    On average, how many minutes do you engage in exercise at this level?: 0 min  Stress: No Stress Concern Present (10/23/2021)   Received from [facility] - Occupational Stress Questionnaire    Feeling of Stress : Not at all  Social Connections: Moderately Isolated (10/23/2021)   Received from [facility]   Social Connection and Isolation Panel    In a typical week, how many times do you talk on the phone with family, friends, or neighbors?: More than three times a week    How often do you get together with friends or relatives?: Three times a week    How often do you attend church or religious services?: Never    Do you belong to any clubs or organizations such as church groups, unions, fraternal or athletic groups, or school groups?: No    How often do you attend meetings of the clubs or organizations you belong to?: Never    Are you married, widowed, divorced, separated, never married, or living with a partner?: Living with partner  Intimate Partner Violence: Not At Risk (10/23/2021)   Received from [facility]   Humiliation, Afraid, Rape, and Kick questionnaire    Within the last year, have you been afraid of your partner or ex-partner?: No    Within the last year, have you been humiliated or emotionally abused in other ways by your partner or ex-partner?: No    Within the last year, have you been kicked, hit, slapped, or otherwise physically hurt by your partner or ex-partner?: No    Within the last year, have you been  raped or forced to have any kind of sexual activity by your partner or ex-partner?: No    Review of Systems   General: Negative for anorexia, weight loss, fever, chills, fatigue, weakness. ENT: Negative for hoarseness, difficulty swallowing , nasal congestion. CV: Negative for chest pain, angina, palpitations, dyspnea on exertion, peripheral edema.  Respiratory: Negative for dyspnea at rest, dyspnea on exertion, cough, sputum, wheezing.  GI: See history of present illness. GU:  Negative for dysuria, hematuria, urinary incontinence, urinary frequency, nocturnal urination.  Endo: Negative for unusual weight change.     Physical Exam   There were no vitals taken for this visit.   General: Well-nourished, well-developed in no acute distress.  Eyes: No icterus. Mouth: Oropharyngeal mucosa moist and pink   Lungs: Clear to auscultation bilaterally.  Heart: Regular rate and rhythm, no murmurs rubs or gallops.  Abdomen: Bowel sounds are normal, nontender, nondistended, no hepatosplenomegaly or masses,  no abdominal bruits or hernia , no rebound or guarding.  Rectal: not performed Extremities: No lower extremity edema. No clubbing or deformities. Neuro: Alert and oriented x 4   Skin: Warm and dry, no jaundice.   Psych: Alert and cooperative, normal mood and affect.  Labs   *** Imaging Studies   No results found.  Assessment/Plan:        Trudie Fuse. Harles Lied, MHS, PA-C [facility]

## 2024-01-27 ENCOUNTER — Encounter: Payer: Self-pay | Admitting: Gastroenterology

## 2024-08-10 ENCOUNTER — Encounter: Payer: Self-pay | Admitting: Gastroenterology
